# Patient Record
Sex: Male | Born: 1965
Health system: Southern US, Community
[De-identification: ages and names within clinical notes are randomized; demographics above are authoritative.]

## PROBLEM LIST (undated history)

## (undated) DIAGNOSIS — G709 Myoneural disorder, unspecified: Secondary | ICD-10-CM

## (undated) DIAGNOSIS — R200 Anesthesia of skin: Secondary | ICD-10-CM

## (undated) DIAGNOSIS — K219 Gastro-esophageal reflux disease without esophagitis: Secondary | ICD-10-CM

## (undated) DIAGNOSIS — G629 Polyneuropathy, unspecified: Secondary | ICD-10-CM

## (undated) DIAGNOSIS — I10 Essential (primary) hypertension: Secondary | ICD-10-CM

## (undated) DIAGNOSIS — F419 Anxiety disorder, unspecified: Secondary | ICD-10-CM

## (undated) DIAGNOSIS — M1990 Unspecified osteoarthritis, unspecified site: Secondary | ICD-10-CM

## (undated) DIAGNOSIS — T753XXA Motion sickness, initial encounter: Secondary | ICD-10-CM

## (undated) DIAGNOSIS — E669 Obesity, unspecified: Secondary | ICD-10-CM

## (undated) HISTORY — DX: Unspecified osteoarthritis, unspecified site: M19.90

## (undated) HISTORY — PX: WISDOM TOOTH EXTRACTION: SHX21

## (undated) HISTORY — PX: LEG SURGERY: SHX1003

## (undated) HISTORY — DX: Anxiety disorder, unspecified: F41.9

---
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
## 2011-11-13 HISTORY — PX: SHOULDER SURGERY: SHX246

## 2016-09-26 DIAGNOSIS — S82843A Displaced bimalleolar fracture of unspecified lower leg, initial encounter for closed fracture: Secondary | ICD-10-CM | POA: Insufficient documentation

## 2016-09-26 DIAGNOSIS — S93429A Sprain of deltoid ligament of unspecified ankle, initial encounter: Secondary | ICD-10-CM | POA: Insufficient documentation

## 2016-09-26 DIAGNOSIS — M24073 Loose body in unspecified ankle: Secondary | ICD-10-CM | POA: Insufficient documentation

## 2016-09-26 DIAGNOSIS — M2408 Loose body, other site: Secondary | ICD-10-CM

## 2017-09-02 DIAGNOSIS — R030 Elevated blood-pressure reading, without diagnosis of hypertension: Secondary | ICD-10-CM | POA: Insufficient documentation

## 2017-09-02 DIAGNOSIS — G5791 Unspecified mononeuropathy of right lower limb: Secondary | ICD-10-CM | POA: Insufficient documentation

## 2017-09-02 DIAGNOSIS — S8490XA Injury of unspecified nerve at lower leg level, unspecified leg, initial encounter: Secondary | ICD-10-CM | POA: Insufficient documentation

## 2017-09-02 DIAGNOSIS — E669 Obesity, unspecified: Secondary | ICD-10-CM | POA: Insufficient documentation

## 2017-09-02 HISTORY — DX: Obesity, unspecified: E66.9

## 2018-04-29 DIAGNOSIS — S9306XA Dislocation of unspecified ankle joint, initial encounter: Secondary | ICD-10-CM | POA: Insufficient documentation

## 2018-12-13 ENCOUNTER — Telehealth: Payer: Self-pay | Admitting: Physician Assistant

## 2018-12-13 DIAGNOSIS — M542 Cervicalgia: Secondary | ICD-10-CM

## 2018-12-13 DIAGNOSIS — M5412 Radiculopathy, cervical region: Secondary | ICD-10-CM

## 2018-12-13 NOTE — Progress Notes (Signed)
Based on what you shared with me it looks like you have a serious condition that should be evaluated in a face to face office visit.  NOTE: If you entered your credit card information for this eVisit, you will not be charged. You may see a "hold" on your card for the $30 but that hold will drop off and you will not have a charge processed.  If you are having a true medical emergency please call 911.  If you need an urgent face to face visit, [facility] has four urgent care centers for your convenience.  If you need care fast and have a high deductible or no insurance consider:   [email or web address] to reserve your spot online an avoid wait times  [facility] [address], [address] [place], [place] [address] 8 am to 8 pm Monday-Friday 10 am to 4 pm Saturday-Sunday *Across the street from [employer]  [facility]  [address]  [place], [address] 8 am to 5 pm Monday-Friday * In the [facility]   The following sites will take your  insurance:  . [facility]  [phone] Get Driving Directions Find a Provider at this Location  [address] [place], [place] [address] . 10 am to 8 pm Monday-Friday . 12 pm to 8 pm Saturday-Sunday   . [facility]  [phone] Get Driving Directions Find a Provider at this Location  [address], [address] [place], [place] [address] . 8 am to 8 pm Monday-Friday . 9 am to 6 pm Saturday . 11 am to 6 pm Sunday   . [facility] [place]  [phone] Get Driving Directions  [address] [place],  [address] . 8 am to 8 pm Monday-Friday . 8 am to 4 pm Saturday-Sunday   Your e-visit answers were reviewed by a board certified advanced clinical practitioner to complete your personal care plan.  Thank you for using e-Visits.  

## 2018-12-23 DIAGNOSIS — S86319A Strain of muscle(s) and tendon(s) of peroneal muscle group at lower leg level, unspecified leg, initial encounter: Secondary | ICD-10-CM | POA: Insufficient documentation

## 2018-12-24 ENCOUNTER — Encounter: Payer: Self-pay | Admitting: Physician Assistant

## 2018-12-24 ENCOUNTER — Ambulatory Visit: Payer: Self-pay | Admitting: Physician Assistant

## 2018-12-24 VITALS — BP 124/86 | HR 83 | Temp 98.4°F | Resp 16 | Ht 70.0 in | Wt 278.0 lb

## 2018-12-24 DIAGNOSIS — Z0000 Encounter for general adult medical examination without abnormal findings: Secondary | ICD-10-CM

## 2018-12-24 DIAGNOSIS — M5412 Radiculopathy, cervical region: Secondary | ICD-10-CM

## 2018-12-24 MED ORDER — PREDNISONE 20 MG PO TABS: ORAL_TABLET | ORAL | 0 refills | Status: DC

## 2018-12-24 MED ORDER — FAMOTIDINE 20 MG PO TABS: 20.0000 mg | ORAL_TABLET | Freq: Every day | ORAL | 0 refills | Status: DC

## 2018-12-24 NOTE — Patient Instructions (Addendum)
Thank you for choosing [facility] for your health care needs.  You have been diagnosed with cervical radiculopathy. You have been prescribed a tapering course of prednisone.  You have also been prescribed famotidine (Pepcid) to help prevent stomach upset while on prednisone.  Recommend applying ice and/or heat to area of discomfort. Perform gentle stretching exercises. Massage area. May use over the counter [place], IcyHot or BioFreeze. May apply prescribed Voltaren gel.  Recommend you establish care with a neurosurgeon or orthopedic surgeon for further evaluation and continued management of cervical radiculopathy.  Recommend you establish care with a family physician. Given list of local providers.  Cervical Radiculopathy  Cervical radiculopathy means that a nerve in the neck is pinched or bruised. This can cause pain or loss of feeling (numbness) that runs from your neck to your arm and fingers. Follow these instructions at home: Managing pain  Take over-the-counter and prescription medicines only as told by your doctor.  If directed, put ice on the injured or painful area. ? Put ice in a plastic bag. ? Place a towel between your skin and the bag. ? Leave the ice on for 20 minutes, 2-3 times per day.  If ice does not help, you can try using heat. Take a warm shower or warm bath, or use a heat pack as told by your doctor.  You may try a gentle neck and shoulder massage. Activity  Rest as needed. Follow instructions from your doctor about any activities to avoid.  Do exercises as told by your doctor or physical therapist. General instructions  If you were given a soft collar, wear it as told by your doctor.  Use a flat pillow when you sleep.  Keep all follow-up visits as told by your doctor. This is important. Contact a doctor if:  Your condition does not improve with treatment. Get help right away if:  Your pain gets worse and is not controlled with medicine.  You  lose feeling or feel weak in your hand, arm, face, or leg.  You have a fever.  You have a stiff neck.  You cannot control when you poop or pee (have incontinence).  You have trouble with walking, balance, or talking. This information is not intended to replace advice given to you by your health care provider. Make sure you discuss any questions you have with your health care provider. Document Released: 10/18/2011 Document Revised: 04/05/2016 Document Reviewed: 12/23/2014   Health Maintenance, Male A healthy lifestyle and preventive care is important for your health and wellness. Ask your health care provider about what schedule of regular examinations is right for you. What should I know about weight and diet? Eat a Healthy Diet  Eat plenty of vegetables, fruits, whole grains, low-fat dairy products, and lean protein.  Do not eat a lot of foods high in solid fats, added sugars, or salt.  Maintain a Healthy Weight Regular exercise can help you achieve or maintain a healthy weight. You should:  Do at least 150 minutes of exercise each week. The exercise should increase your heart rate and make you sweat (moderate-intensity exercise).  Do strength-training exercises at least twice a week. Watch Your Levels of Cholesterol and Blood Lipids  Have your blood tested for lipids and cholesterol every 5 years starting at 52 years of age. If you are at high risk for heart disease, you should start having your blood tested when you are 52 years old. You may need to have your cholesterol levels checked more often  if: ? Your lipid or cholesterol levels are high. ? You are older than 52 years of age. ? You are at high risk for heart disease. What should I know about cancer screening? Many types of cancers can be detected early and may often be prevented. Lung Cancer  You should be screened every year for lung cancer if: ? You are a current smoker who has smoked for at least 30 years. ? You  are a former smoker who has quit within the past 15 years.  Talk to your health care provider about your screening options, when you should start screening, and how often you should be screened. Colorectal Cancer  Routine colorectal cancer screening usually begins at 52 years of age and should be repeated every 5-10 years until you are 52 years old. You may need to be screened more often if early forms of precancerous polyps or small growths are found. Your health care provider may recommend screening at an earlier age if you have risk factors for colon cancer.  Your health care provider may recommend using home test kits to check for hidden blood in the stool.  A small camera at the end of a tube can be used to examine your colon (sigmoidoscopy or colonoscopy). This checks for the earliest forms of colorectal cancer. Prostate and Testicular Cancer  Depending on your age and overall health, your health care provider may do certain tests to screen for prostate and testicular cancer.  Talk to your health care provider about any symptoms or concerns you have about testicular or prostate cancer. Skin Cancer  Check your skin from head to toe regularly.  Tell your health care provider about any new moles or changes in moles, especially if: ? There is a change in a mole's size, shape, or color. ? You have a mole that is larger than a pencil eraser.  Always use sunscreen. Apply sunscreen liberally and repeat throughout the day.  Protect yourself by wearing long sleeves, pants, a wide-brimmed hat, and sunglasses when outside. What should I know about heart disease, diabetes, and high blood pressure?  If you are 42-44 years of age, have your blood pressure checked every 3-5 years. If you are 84 years of age or older, have your blood pressure checked every year. You should have your blood pressure measured twice-once when you are at a hospital or clinic, and once when you are not at a hospital or  clinic. Record the average of the two measurements. To check your blood pressure when you are not at a hospital or clinic, you can use: ? An automated blood pressure machine at a pharmacy. ? A home blood pressure monitor.  Talk to your health care provider about your target blood pressure.  If you are between 38-59 years old, ask your health care provider if you should take aspirin to prevent heart disease.  Have regular diabetes screenings by checking your fasting blood sugar level. ? If you are at a normal weight and have a low risk for diabetes, have this test once every three years after the age of 35. ? If you are overweight and have a high risk for diabetes, consider being tested at a younger age or more often.  A one-time screening for abdominal aortic aneurysm (AAA) by ultrasound is recommended for men aged 6-75 years who are current or former smokers. What should I know about preventing infection? Hepatitis B If you have a higher risk for hepatitis B, you should be  screened for this virus. Talk with your health care provider to find out if you are at risk for hepatitis B infection. Hepatitis C Blood testing is recommended for:  Everyone born from [birth year] through 1964.  Anyone with known risk factors for hepatitis C. Sexually Transmitted Diseases (STDs)  You should be screened each year for STDs including gonorrhea and chlamydia if: ? You are sexually active and are younger than 52 years of age. ? You are older than 52 years of age and your health care provider tells you that you are at risk for this type of infection. ? Your sexual activity has changed since you were last screened and you are at an increased risk for chlamydia or gonorrhea. Ask your health care provider if you are at risk.  Talk with your health care provider about whether you are at high risk of being infected with HIV. Your health care provider may recommend a prescription medicine to help prevent HIV  infection. What else can I do?  Schedule regular health, dental, and eye exams.  Stay current with your vaccines (immunizations).  Do not use any tobacco products, such as cigarettes, chewing tobacco, and e-cigarettes. If you need help quitting, ask your health care provider.  Limit alcohol intake to no more than 2 drinks per day. One drink equals 12 ounces of beer, 5 ounces of wine, or 1 ounces of hard liquor.  Do not use street drugs.  Do not share needles.  Ask your health care provider for help if you need support or information about quitting drugs.  Tell your health care provider if you often feel depressed.  Tell your health care provider if you have ever been abused or do not feel safe at home. This information is not intended to replace advice given to you by your health care provider. Make sure you discuss any questions you have with your health care provider. Document Released: 04/26/2008 Document Revised: 06/27/2016 Document Reviewed: 08/02/2015 Elsevier Interactive Patient Education  2019 [employer].  Chartered certified accountant Patient Education  [employer].

## 2018-12-24 NOTE — Progress Notes (Signed)
Patient ID: Adam Hernandez DOB: 1966-03-08 AGE: 53 y.o. MRN: [id number]   PCP: No primary care provider on file.   Chief Complaint:  Chief Complaint  Patient presents with  . CPE/Neck issue     Subjective:    HPI:  Adam Hernandez is a 53 y.o. male presents for evaluation  Chief Complaint  Patient presents with  . CPE/Neck issue   54 year old male presents to [facility] for basic wellness physical. Does not currently have a PCP. Patient used to work for [employer]. Had biennial DOT physicals. Denies previous history of blood pressure, pulse, urinalysis, visual acuity test, hearing test (forced whisper), etc. Patient denies any previous chronic medical condition diagnoses; including HTN, hypercholesterolemia, CAD, DM2. Patient, other than current orthopedic medications, has never been on a prescription medication regularly.   Patient saw his eye doctor last week. Has a new pair of glasses coming in the mail. Patient wears glasses for near sightedness. Has OTC reading glasses, rarely uses.  Patient is interested in weight loss. States since semi-recent right ankle injury has gained 50lbs.   Patient states his wife started a job with [employer] one year ago. They both received a letter in the mail that stated to keep health insurance costs low, they would have to have an annual physical exam performed.  Patient also presents with second complaint. Patient reports two week history of neck pain. Began after picking up a 12-pack of soda. Leaned to the left side wrong, felt pain immediately. Has persisted; not worsened or improved. Less severe with no movement. Exacerbated with neck movement. Patient in 2012 suffered Worker's Comp injury with [employer]; 500lb truck pull-down door fell/became unhinged, and struck patient. Patient suffered two herniated cervical discs (C4-5 and C5-6) as well as left rotator cuff tear. Patient had MRI of both cervical spine and left shoulder. Underwent left  shoulder repair. Performed PT for neck and shoulder. Patient states pain never fully resolved, but he was able to return to work. Patient states approximately once every two years, he will irritate his neck and develop radiculopathy. Patient reports currently has posterior neck pain, associated posterior left shoulder muscle spasm/tightness, and sharp shooting pain along left arm with numbness/tingling in left hand (thumb, index finger, and middle finger). Denies arm weakness. Patient with chronic limited motion (15% disability) in neck and left shoulder since injury. Patient is right hand dominant.  Patient is interested in establishing care with a local neurosurgeon for further evaluation and treatment/management of herniated cervical discs.  Patient completed an E-Visit on 12/13/2018 with Philis Fendt PA-C. Reported mid to upper back pain. Moderate in severity. Seconday to an injury. History of cervical disc herniation 8 years ago. States he frequently has "flare ups". Requested course of prednisone. Provider refunded money and advised patient be seen in-person for further evaluation and treatment.  Patient currently under care of [facility], Dr. Daniel Nones, for right ankle injury. Worker's Comp. Diagnosis of traumatic arthropathy of the ankle/foot, peroneal nerve injury, closed traumatic dislocation of ankle joint, and strain of peroneal tendon. Patient last seen yesterday. Scheduled for an MRI. Currently on multiple medications: Celebrex (Meloxicam replacement, due to side effect of tinnitus), Voltaren gel, Gabapentin, and Tylenol/ibuprofen prn.  A limited review of symptoms was performed, pertinent positives and negatives as mentioned in HPI.  The following portions of the patient's history were reviewed and updated as appropriate: allergies, current medications and past medical history.  Patient Active Problem List   Diagnosis Date Noted  .  Closed dislocation of ankle 04/29/2018  .  Elevated blood pressure reading 09/02/2017  . Neuropathy of right ankle 09/02/2017  . Obesity (BMI 35.0-39.9 without comorbidity) 09/02/2017  . Injury of nerve of lower extremity 09/02/2017  . Closed bimalleolar fracture 09/26/2016    No Known Allergies  Current Outpatient Medications on File Prior to Visit  Medication Sig Dispense Refill  . acetaminophen (TYLENOL) 500 MG tablet Take by mouth.    . celecoxib (CELEBREX) 200 MG capsule Celebrex 200 mg capsule  Take 1 capsule every day by oral route.    . diclofenac sodium (VOLTAREN) 1 % GEL diclofenac 1 % topical gel  APPLY 2 GRAM TO THE AFFECTED AREA(S) BY TOPICAL ROUTE 4 TIMES PER DAY    . gabapentin (NEURONTIN) 400 MG capsule gabapentin 400 mg capsule  TAKE 1 CAPSULE BY MOUTH FOUR TIMES DAILY    . ibuprofen (ADVIL,MOTRIN) 200 MG tablet Take by mouth.    . sildenafil (REVATIO) 20 MG tablet sildenafil (pulmonary hypertension) 20 mg tablet  Take 2 tablets by mouth one hour prior to sex as needed. Do not take more than one dose every 24 hours.     No current facility-administered medications on file prior to visit.        Objective:   Vitals:   12/24/18 1009  BP: 124/86  Pulse: 83  Resp: 16  Temp: 98.4 F (36.9 C)  SpO2: 96%     Wt Readings from Last 3 Encounters:  12/24/18 278 lb (126.1 kg)    Physical Exam:   General Appearance:  Patient sitting comfortably on examination table. Conversational. Kermit Balo self-historian. In no acute distress. Afebrile.   Head:  Normocephalic, without obvious abnormality, atraumatic  Eyes:  PERRL, conjunctiva/corneas clear, EOM's intact Good vision, with corrective lenses (glasses)  Ears:  Bilateral ear canals WNL. No erythema or edema. No discharge/drainage. Bilateral TMs WNL. No erythema, injection, or serous effusion. No scar tissue.  Nose: Nares normal, septum midline. No discharge. Normal mucosa. No sinus tenderness with percussion/palpation.  Throat: Lips, mucosa, and tongue normal;  teeth and gums normal. Throat reveals no erythema. Tonsils with no enlargement or exudate.  Neck: Supple, symmetrical, trachea midline, no adenopathy  Lungs:   Clear to auscultation bilaterally, respirations unlabored  Heart:  Regular rate and rhythm, S1 and S2 normal, no murmur, rub, or gallop (Patient reports childhood murmur, no murmur audible to provider today)  Abdomen:   Soft, non-tender, bowel sounds active all four quadrants,  no masses, no organomegaly  Extremities: Cervical, thoracic and lumbar spine normal to inspection. Mild midline cervical tenderness at superior aspect. No midline tenderness along lower cervical, thoracic or lumbar spine. No palpable stepoff, deformity, or crepitus. Limited ROM of neck; pain with right rotation, left lateral flexion, and flexion (chin to chest). Full ROM of right shoulder. Limited ROM of left shoulder; unable to perform Apley scratch test. Weakness with empty can test. 5/5 motor strength; pain with abduction against resistance. Strong radial pulse. Good grip strength bilaterally. Brisk capillary refill.  Pulses: 2+ and symmetric  Skin: Skin color, texture, turgor normal, no rashes or lesions  Lymph nodes: Cervical, supraclavicular, and axillary nodes normal  Neurologic: Normal    Assessment & Plan:    Exam findings, diagnosis etiology and medication use and indications reviewed with patient. Follow-Up and discharge instructions provided. No emergent/urgent issues found on exam.  Patient education was provided.   Patient verbalized understanding of information provided and agrees with plan of care (POC), all questions answered.  The patient is advised to call or return to clinic if condition does not see an improvement in symptoms, or to seek the care of the closest emergency department if condition worsens with the below plan.    1. Cervical radiculopathy - predniSONE (DELTASONE) 20 MG tablet; Take 3 tabs by mouth once a day x 3 days, then 2 tabs  by mouth once a day x 3 days, then 1 tab by mouth once a day x 3 days  Dispense: 18 tablet; Refill: 0 - famotidine (PEPCID) 20 MG tablet; Take 1 tablet (20 mg total) by mouth daily for 9 days.  Dispense: 9 tablet; Refill: 0  2. General medical examination  Patient [facility] to [facility] -  with two requests. Acute care visit for cervical radiculopathy. S/p injury in 2012 with known herniated cervical discs. Recent irritation via lifting heavy object. Prescribed prednisone taper; has worked for patient in the past. Discussed ice/heat, rest, stretching exercises, and OTC topical [place] or IcyHot. Advised patient schedule appointment with neurosurgeon or orthopedic surgeon; feel given frequent flare-ups, patient would benefit from repeat MRI (last cervical MRI at least 7 years ago), possible CESIs, and/or discussion of surgery. Advised patient, if he develops worsening pain or weakness should be seen immediately, at ED or orthopedic urgent care. Patient agrees with plan.  Patient also with request for general physical examination. Performed basic physical. Discussed health maintenance. Had long discussion with patient in regards to importance of establishing care with a PCP. Patient in need of regular health checks including lipid panel, thyroid panel, PSA (maybe), A1C, possible tetanus update, possible candidate for herpes zoster vaccine, scheduling of colonoscopy, etc. Patient also interested in nutritionist referral for weight loss.  Discussed with patient, unsure if [facility] basic wellness physical is accepted as annual examination for insurance purposes. Patient stated he understood and was ok with $40 charge regardless.    Darlin Priestly, MHS, PA-C Montey Hora, MHS, PA-C Advanced Practice Provider [facility]  [address], [place] [place], [place], [place] [address] (p):   [phone] [email or web address]

## 2018-12-26 ENCOUNTER — Telehealth: Payer: Self-pay | Admitting: Emergency Medicine

## 2018-12-26 NOTE — Telephone Encounter (Signed)
Left message following up on visit with [facility] 

## 2019-01-06 DIAGNOSIS — M659 Synovitis and tenosynovitis, unspecified: Secondary | ICD-10-CM | POA: Insufficient documentation

## 2019-01-15 ENCOUNTER — Encounter (INDEPENDENT_AMBULATORY_CARE_PROVIDER_SITE_OTHER): Payer: Self-pay | Admitting: Specialist

## 2019-01-15 ENCOUNTER — Ambulatory Visit (INDEPENDENT_AMBULATORY_CARE_PROVIDER_SITE_OTHER): Payer: 59 | Admitting: Specialist

## 2019-01-15 ENCOUNTER — Ambulatory Visit (INDEPENDENT_AMBULATORY_CARE_PROVIDER_SITE_OTHER): Payer: 59

## 2019-01-15 VITALS — BP 133/69 | HR 72 | Ht 71.0 in | Wt 270.0 lb

## 2019-01-15 DIAGNOSIS — M4722 Other spondylosis with radiculopathy, cervical region: Secondary | ICD-10-CM

## 2019-01-15 DIAGNOSIS — M5030 Other cervical disc degeneration, unspecified cervical region: Secondary | ICD-10-CM

## 2019-01-15 DIAGNOSIS — M542 Cervicalgia: Secondary | ICD-10-CM

## 2019-01-15 MED ORDER — DIAZEPAM 5 MG PO TABS: ORAL_TABLET | ORAL | 0 refills | Status: DC

## 2019-01-15 MED ORDER — METHYLPREDNISOLONE 4 MG PO TABS: ORAL_TABLET | ORAL | 0 refills | Status: DC

## 2019-01-15 NOTE — Patient Instructions (Signed)
Avoid overhead lifting and overhead use of the arms. Do not lift greater than 5 lbs. Adjust head rest in vehicle to prevent hyperextension if rear ended. Take extra precautions to avoid falling. MRI of the cervical spine to assess for left C5-6 and Left C6-7is ordered.

## 2019-01-15 NOTE — Progress Notes (Addendum)
Office Visit Note   Patient: Adam Hernandez           Date of Birth: [birth year]           MRN: [id number] Visit Date: 01/15/2019              Requested by: No referring provider defined for this encounter. PCP: No primary care provider on file.   Assessment & Plan: Visit Diagnoses:  1. Cervicalgia   2. Other cervical disc degeneration, unspecified cervical region   3. Other spondylosis with radiculopathy, cervical region     Plan: Avoid overhead lifting and overhead use of the arms. Do not lift greater than 5 lbs. Adjust head rest in vehicle to prevent hyperextension if rear ended. Take extra precautions to avoid falling. MRI of the cervical spine to assess for left C5-6 and Left C6-7is ordered.   Follow-Up Instructions: Return in about 3 weeks (around 02/05/2019).   Orders:  Orders Placed This Encounter  Procedures  . XR Cervical Spine 2 or 3 views  . MR Cervical Spine w/o contrast   Meds ordered this encounter  Medications  . diazepam (VALIUM) 5 MG tablet    Sig: Take one tablet at the MRI and may repeat x 1.    Dispense:  2 tablet    Refill:  0  . methylPREDNISolone (MEDROL) 4 MG tablet    Sig: 6 day medrol dose pak, take as directed.    Dispense:  21 tablet    Refill:  0      Procedures: No procedures performed   Clinical Data: No additional findings.   Subjective: Chief Complaint  Patient presents with  . 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 He had an injury to the left shoulder requiring surgery by DrMallie Mussel and he did have a small chip of bone at the C4-5 level treated conservatively. After his  Shoulder surgery returned to work in May after November surgery. Worked another 3 years before stopping after having a non work related right ankle fracture. He has been experiencing left hand numbness and tingling into the left hand radial 3  digits. The numbness in the left hand is worsening, there is increased clumbsiness and decreased grip strength. He is having difficulty with sleep due to left arm pain and pain is worsened with turning head to the left and extension Flexion is not as bad, raising and using the left arm over head sets off the pain. Left biceps is weak. Left shoulder injury did result in some weakness ever since the surgery and injury on the Job. He is on pain meds for discomfort in the right ankle and foot. Gabapentin 400 four time per day, uses celebrex and diclofenac gel and supplements with tylenol. He sees Dr.Calvin in pain management in [place] with [facility]. No gait disturbance, no balance difficulty, no bowel or bladder difficulty.    Review of Systems  Constitutional: Negative.   HENT: Negative.   Eyes: Negative.   Respiratory: Negative.   Cardiovascular: Negative.   Gastrointestinal: Negative.   Endocrine: Negative.   Genitourinary: Negative.   Musculoskeletal: Negative.   Skin: Negative.   Allergic/Immunologic: Negative.   Neurological: Negative.   Hematological: Negative.   Psychiatric/Behavioral: Negative.      Objective: Vital Signs: BP 133/69 (BP Location: Left Arm, Patient Position: Sitting)  Pulse 72   Ht 5\' 11"  (1.803 m)   Wt 270 lb (122.5 kg)   BMI 37.66 kg/m   Physical Exam Constitutional:      Appearance: He is well-developed.  HENT:     Head: Normocephalic and atraumatic.  Eyes:     Pupils: Pupils are equal, round, and reactive to light.  Neck:     Musculoskeletal: Normal range of motion and neck supple.  Pulmonary:     Effort: Pulmonary effort is normal.     Breath sounds: Normal breath sounds.  Abdominal:     General: Bowel sounds are normal.     Palpations: Abdomen is soft.  Skin:    General: Skin is warm and dry.  Neurological:     Mental Status: He is alert and oriented to person, place, and time.  Psychiatric:        Behavior: Behavior normal.          Thought Content: Thought content normal.        Judgment: Judgment normal.     Back Exam   Tenderness  The patient is experiencing tenderness in the cervical.  Range of Motion  Extension:  50 abnormal  Flexion: normal  Lateral bend right: 70  Lateral bend left:  50 abnormal   Muscle Strength  Right Quadriceps:  5/5  Left Quadriceps:  5/5  Right Hamstrings:  5/5  Left Hamstrings:  5/5   Tests  Straight leg raise right: negative Straight leg raise left: negative  Reflexes  Patellar: normal Achilles: normal Biceps: normal Babinski's sign: normal   Other  Toe walk: normal Heel walk: normal Sensation: normal Gait: normal  Erythema: no back redness Scars: absent  Comments:  Motor without focal deficit.      Specialty Comments:  No specialty comments available.  Imaging: Xr Cervical Spine 2 Or 3 Views  Result Date: 01/15/2019 AP and lateral flexion and extension radiographs of the cervical spine shows DDD C5-6, C6-7 and C7-T1. There is straightening of the cervical spine over the lower cervical segments. Uncovertebral changes bilateral C5-6 and C6-7 with likely foramenal encroachment at these levels.     PMFS History: Patient Active Problem List   Diagnosis Date Noted  . Closed dislocation of ankle 04/29/2018  . Elevated blood pressure reading 09/02/2017  . Neuropathy of right ankle 09/02/2017  . Obesity (BMI 35.0-39.9 without comorbidity) 09/02/2017  . Injury of nerve of lower extremity 09/02/2017  . Closed bimalleolar fracture 09/26/2016   History reviewed. No pertinent past medical history.  History reviewed. No pertinent family history.  History reviewed. No pertinent surgical history. Social History   Occupational History  . Not on file  Tobacco Use  . Smoking status: Never Smoker  . Smokeless tobacco: Never Used  Substance and Sexual Activity  . Alcohol use: Not on file  . Drug use: Not on file  . Sexual activity: Not on file

## 2019-01-15 NOTE — Addendum Note (Signed)
Addended by: [name] on: 01/15/2019 01:00 PM   Modules accepted: Orders

## 2019-01-20 ENCOUNTER — Encounter: Payer: Self-pay | Admitting: Family Medicine

## 2019-01-20 ENCOUNTER — Ambulatory Visit (INDEPENDENT_AMBULATORY_CARE_PROVIDER_SITE_OTHER): Payer: 59 | Admitting: Family Medicine

## 2019-01-20 VITALS — BP 120/78 | HR 76 | Ht 71.0 in | Wt 276.0 lb

## 2019-01-20 DIAGNOSIS — J0100 Acute maxillary sinusitis, unspecified: Secondary | ICD-10-CM | POA: Diagnosis not present

## 2019-01-20 DIAGNOSIS — Z7689 Persons encountering health services in other specified circumstances: Secondary | ICD-10-CM | POA: Diagnosis not present

## 2019-01-20 DIAGNOSIS — D1801 Hemangioma of skin and subcutaneous tissue: Secondary | ICD-10-CM | POA: Diagnosis not present

## 2019-01-20 DIAGNOSIS — D229 Melanocytic nevi, unspecified: Secondary | ICD-10-CM

## 2019-01-20 MED ORDER — AMOXICILLIN 500 MG PO CAPS: 500.0000 mg | ORAL_CAPSULE | Freq: Three times a day (TID) | ORAL | 0 refills | Status: DC

## 2019-01-20 NOTE — Progress Notes (Signed)
Date:  01/20/2019   Name:  Adam Hernandez   DOB:  07-21-1966   MRN:  [id number]   Chief Complaint: Establish Care and Nevus (on back)  Patient is a 53 year old male who presents for an establish as new patient exam. The patient reports the following problems: chronic pain/nevus. Health maintenance has been reviewed  Sinusitis  This is a new problem. The current episode started in the past 7 days. The problem has been waxing and waning since onset. Maximum temperature: low grade. His pain is at a severity of 2/10. Associated symptoms include congestion and sinus pressure. Pertinent negatives include no chills, coughing, ear pain, headaches, neck pain, shortness of breath or sore throat.    Review of Systems  Constitutional: Negative for chills and fever.  HENT: Positive for congestion and sinus pressure. Negative for drooling, ear discharge, ear pain and sore throat.   Respiratory: Negative for cough, shortness of breath and wheezing.   Cardiovascular: Negative for chest pain, palpitations and leg swelling.  Gastrointestinal: Negative for abdominal pain, blood in stool, constipation, diarrhea and nausea.  Endocrine: Negative for polydipsia.  Genitourinary: Negative for dysuria, frequency, hematuria and urgency.  Musculoskeletal: Negative for back pain, myalgias and neck pain.  Skin: Negative for rash.  Allergic/Immunologic: Negative for environmental allergies.  Neurological: Negative for dizziness and headaches.  Hematological: Does not bruise/bleed easily.  Psychiatric/Behavioral: Negative for suicidal ideas. The patient is not nervous/anxious.     Patient Active Problem List   Diagnosis Date Noted  . Synovitis and tenosynovitis 01/06/2019  . Strain of peroneal tendon 12/23/2018  . Closed dislocation of ankle 04/29/2018  . Elevated blood pressure reading 09/02/2017  . Neuropathy of right ankle 09/02/2017  . Obesity (BMI 35.0-39.9 without comorbidity) 09/02/2017  . Injury of  nerve of lower extremity 09/02/2017  . Closed bimalleolar fracture 09/26/2016  . Loose body in ankle and foot joint 09/26/2016  . Sprain of deltoid ligament of ankle 09/26/2016    No Known Allergies  Past Surgical History:  Procedure Laterality Date  . LEG SURGERY Right    fractured leg and ankle- has screws and pins  . SHOULDER SURGERY Left 2013   labrial tear    Social History   Tobacco Use  . Smoking status: Never Smoker  . Smokeless tobacco: Never Used  Substance Use Topics  . Alcohol use: Yes    Comment: 5-10 per week  . Drug use: Never     Medication list has been reviewed and updated.  Current Meds  Medication Sig  . acetaminophen (TYLENOL) 500 MG tablet Take by mouth.  . celecoxib (CELEBREX) 200 MG capsule Celebrex 200 mg capsule  Take 1 capsule every day by oral route.  . diazepam (VALIUM) 5 MG tablet Take one tablet at the MRI and may repeat x 1.  . diclofenac sodium (VOLTAREN) 1 % GEL Pain management  . gabapentin (NEURONTIN) 400 MG capsule Pain management  . ibuprofen (ADVIL,MOTRIN) 200 MG tablet Take by mouth.  Marland Kitchen omeprazole (PRILOSEC) 20 MG capsule Take 20 mg by mouth as needed.   . sildenafil (REVATIO) 20 MG tablet sildenafil (pulmonary hypertension) 20 mg tablet  Take 2 tablets by mouth one hour prior to sex as needed. Do not take more than one dose every 24 hours.    PHQ 2/9 Scores 01/20/2019  PHQ - 2 Score 0  PHQ- 9 Score 0    Physical Exam Vitals signs and nursing note reviewed.  HENT:  Head: Normocephalic.     Jaw: There is normal jaw occlusion.     Right Ear: Tympanic membrane, ear canal and external ear normal.     Left Ear: Tympanic membrane, ear canal and external ear normal.     Nose:     Right Turbinates: Swollen.     Left Turbinates: Swollen.     Right Sinus: Maxillary sinus tenderness present. No frontal sinus tenderness.     Left Sinus: Maxillary sinus tenderness present. No frontal sinus tenderness.  Eyes:     General: No  scleral icterus.       Right eye: No discharge.        Left eye: No discharge.     Conjunctiva/sclera: Conjunctivae normal.     Pupils: Pupils are equal, round, and reactive to light.  Neck:     Musculoskeletal: Normal range of motion and neck supple.     Thyroid: No thyromegaly.     Vascular: No JVD.     Trachea: No tracheal deviation.  Cardiovascular:     Rate and Rhythm: Normal rate and regular rhythm.     Heart sounds: Normal heart sounds. No murmur. No friction rub. No gallop.   Pulmonary:     Effort: No respiratory distress.     Breath sounds: Normal breath sounds. No wheezing or rales.  Abdominal:     General: Bowel sounds are normal.     Palpations: Abdomen is soft. There is no mass.     Tenderness: There is no abdominal tenderness. There is no guarding or rebound.  Musculoskeletal: Normal range of motion.        General: No tenderness.  Lymphadenopathy:     Cervical: No cervical adenopathy.  Skin:    General: Skin is warm.     Findings: No rash.          Comments: Irregular border right scapula/ ?cyst/skin tag/hemangioma of left scrotal area  Neurological:     Mental Status: He is alert and oriented to person, place, and time.     Cranial Nerves: No cranial nerve deficit.     Deep Tendon Reflexes: Reflexes are normal and symmetric.     Wt Readings from Last 3 Encounters:  01/20/19 276 lb (125.2 kg)  01/15/19 270 lb (122.5 kg)  12/24/18 278 lb (126.1 kg)    BP 120/78   Pulse 76   Ht 5\' 11"  (1.803 m)   Wt 276 lb (125.2 kg)   BMI 38.49 kg/m   Assessment and Plan: 1. Establishing care with new doctor, encounter for Patient establish care with new physician today.  Patient's chart was reviewed medical visits, medications, labs, and referrals to specialist.  2. Acute maxillary sinusitis, recurrence not specified Patient has had a sinus infection for the past several days and exam is consistent with a maxillary sinus infection for which amoxicillin 500 mg 3  times a day was initiated. - amoxicillin (AMOXIL) 500 MG capsule; Take 1 capsule (500 mg total) by mouth 3 (three) times daily.  Dispense: 30 capsule; Refill: 0  3. Nevus Patient had a orthopedic appointment in which the nevus was noted on the back there was one irregular nevus it was noted in the right scapular area for which we will refer to dermatology for evaluation patient has been experiencing no pain or bleeding from this. - Ambulatory referral to Dermatology  4. Hemangioma of skin Either a skin tag/hemangioma/cyst was noted of the left scrotal area.  This does not appear to be  of concern but will ask dermatology to render an opinion as to whether the skin to be removed or watched as he is. - Ambulatory referral to Dermatology

## 2019-01-22 DIAGNOSIS — Z0000 Encounter for general adult medical examination without abnormal findings: Secondary | ICD-10-CM

## 2019-02-03 ENCOUNTER — Encounter (INDEPENDENT_AMBULATORY_CARE_PROVIDER_SITE_OTHER): Payer: Self-pay | Admitting: Specialist

## 2019-02-11 DIAGNOSIS — D1800 Hemangioma unspecified site: Secondary | ICD-10-CM | POA: Diagnosis not present

## 2019-02-11 DIAGNOSIS — D225 Melanocytic nevi of trunk: Secondary | ICD-10-CM | POA: Diagnosis not present

## 2019-02-11 DIAGNOSIS — D485 Neoplasm of uncertain behavior of skin: Secondary | ICD-10-CM | POA: Diagnosis not present

## 2019-02-16 ENCOUNTER — Encounter (INDEPENDENT_AMBULATORY_CARE_PROVIDER_SITE_OTHER): Payer: Self-pay | Admitting: Specialist

## 2019-02-18 ENCOUNTER — Other Ambulatory Visit (INDEPENDENT_AMBULATORY_CARE_PROVIDER_SITE_OTHER): Payer: Self-pay | Admitting: Specialist

## 2019-02-18 MED ORDER — METHYLPREDNISOLONE 4 MG PO TBPK: ORAL_TABLET | ORAL | 0 refills | Status: DC

## 2019-02-19 ENCOUNTER — Other Ambulatory Visit: Payer: Self-pay

## 2019-02-19 ENCOUNTER — Ambulatory Visit
Admission: RE | Admit: 2019-02-19 | Discharge: 2019-02-19 | Disposition: A | Discharge status: Home / Self Care | Payer: 59 | Source: Ambulatory Visit | Attending: Specialist | Admitting: Specialist

## 2019-02-19 DIAGNOSIS — M5030 Other cervical disc degeneration, unspecified cervical region: Secondary | ICD-10-CM

## 2019-02-19 DIAGNOSIS — M542 Cervicalgia: Secondary | ICD-10-CM

## 2019-02-25 NOTE — Progress Notes (Signed)
Has appt on 02/26/2019

## 2019-02-26 ENCOUNTER — Ambulatory Visit (INDEPENDENT_AMBULATORY_CARE_PROVIDER_SITE_OTHER): Payer: 59 | Admitting: Specialist

## 2019-02-26 ENCOUNTER — Other Ambulatory Visit: Payer: Self-pay

## 2019-02-26 ENCOUNTER — Encounter (INDEPENDENT_AMBULATORY_CARE_PROVIDER_SITE_OTHER): Payer: Self-pay | Admitting: Specialist

## 2019-02-26 VITALS — BP 146/90 | HR 79 | Ht 71.0 in | Wt 270.0 lb

## 2019-02-26 DIAGNOSIS — M5412 Radiculopathy, cervical region: Secondary | ICD-10-CM

## 2019-02-26 DIAGNOSIS — M5010 Cervical disc disorder with radiculopathy, unspecified cervical region: Secondary | ICD-10-CM | POA: Diagnosis not present

## 2019-02-26 DIAGNOSIS — M542 Cervicalgia: Secondary | ICD-10-CM | POA: Diagnosis not present

## 2019-02-26 DIAGNOSIS — M4722 Other spondylosis with radiculopathy, cervical region: Secondary | ICD-10-CM | POA: Diagnosis not present

## 2019-02-26 NOTE — Progress Notes (Signed)
Office Visit Note   Patient: Adam Hernandez           Date of Birth: 1966/05/09           MRN: [id number] Visit Date: 02/26/2019              Requested by: No referring provider defined for this encounter. PCP: Juline Patch, MD   Assessment & Plan: Visit Diagnoses:  1. Cervicalgia   2. Cervical radiculopathy   3. Other spondylosis with radiculopathy, cervical region   4. Herniation of cervical intervertebral disc with radiculopathy     Plan: Avoid overhead lifting and overhead use of the arms. Do not lift greater than 5 lbs. Adjust head rest in vehicle to prevent hyperextension if rear ended. Take extra precautions to avoid falling, including use of a cane if you feel weak. Scheduling secretary Kandice Hams. will call you to arrange for surgery for your cervical spine. If you wish a second opinion please let us know and we can arrange for you. If you have worsening arm or leg numbness or weakness please call or go to an ER. We will contact your cardiologist and primary care physicians to seek clearance for your surgery. Surgery will be an anterior discectomy and fusion at the C5-6 and C6-7 levels with decompression of the cervical spinal canal at C5-6 and C6-7 and removal of bone and disc pressing on the left sided C6 and C7nerve roots with bone grafting and plate and screws, Local bone graft as well and allograft bone graft and vivigen.  Risks of surgery include risks of infection, bleeding and risks to the spinal cord and  Risks of sore throat and difficulty swallowing which should  Improve over the next 4-6 weeks following surgery. Surgery is indicated due to upper extremity radiculopathy, lermittes phenomena and falls. In the future surgery at adjacent levels may be necessary but these levels do not appear to be related to your current symptoms or signs. Taking celebrex, gabapentin and voltaren gel for pain. He has taken a steroid dose pak.  Due to panedemic I  recommend that we try to put surgery off for about one month, refer for left C7-T1 ESI. If you are unable to tolerate the discomfort then surgery can be done as an emergency but due to risk of virus and surgeries being delayed  At the hospital I recommend waiting about one month.      Follow-Up Instructions: No follow-ups on file.   Orders:  No orders of the defined types were placed in this encounter.  No orders of the defined types were placed in this encounter.     Procedures: No procedures performed   Clinical Data: Findings:  CLINICAL DATA:  Cervicalgia  EXAM: MRI CERVICAL SPINE WITHOUT CONTRAST  TECHNIQUE: Multiplanar, multisequence MR imaging of the cervical spine was performed. No intravenous contrast was administered.  COMPARISON:  Cervical radiographs 01/15/2019  FINDINGS: Alignment: Normal  Vertebrae: Normal bone marrow.  Negative for fracture or mass  Cord: No cord signal abnormality identified. Cord evaluation limited by motion  Posterior Fossa, vertebral arteries, paraspinal tissues: Negative  Disc levels:  C2-3: Negative  C3-4: Negative  C4-5: Left foraminal encroachment due to spurring with expected impingement left C5 nerve root. Spinal canal and right foramen patent  C5-6: Moderate disc degeneration with diffuse uncinate spurring. Moderate right foraminal encroachment and severe left foraminal encroachment. Cord flattening with moderate spinal stenosis.  C6-7: Right paracentral disc protrusion with mild right foraminal encroachment.  Cord flattening and mild spinal stenosis on the right  C7-T1: Bilateral facet degeneration contributing to mild foraminal narrowing bilaterally.  IMPRESSION: Left foraminal encroachment C4-5  Moderate right foraminal encroachment and severe left foraminal encroachment C5-6 due to spurring. Moderate spinal stenosis C5-6  Right sided disc protrusion at C6-7 with mild right foraminal  encroachment  Mild foraminal narrowing bilaterally C7-T1.   Electronically Signed   By: Franchot Gallo M.D.   On: 02/19/2019 10:43     Subjective: Chief Complaint  Patient presents with  . Neck - Follow-up    MRI Review    53 year old right handed male with history of neck pain and radiation into the left arm with weakness in left arm and numbness. He is dropping items and noticing increasing clumbsiness and he is not using the left arm due to feeling of weakness and tendency to drop. He has increased neck and left arm pain and weakness. No bowel or bladder difficulty. He is seeing a  Pain management specialist and has a plan to undergo removal of plate and screws with Dr. Diona Browner. He has pain in the right ankle chronicly and will eventually need removal of the hardware right ankle. Numbness and tingling left thumb, index and middle finger. Some also into the left little and ring finger.   Review of Systems  Constitutional: Positive for activity change and unexpected weight change. Negative for appetite change, chills, diaphoresis, fatigue and fever.  HENT: Negative for congestion, dental problem, drooling, ear discharge, ear pain, facial swelling, hearing loss, mouth sores, nosebleeds, postnasal drip, rhinorrhea, sinus pressure, sinus pain, sneezing, sore throat, tinnitus, trouble swallowing and voice change.   Eyes: Negative.  Negative for photophobia, pain, discharge, redness, itching and visual disturbance.  Respiratory: Negative.  Negative for apnea, cough, choking, chest tightness, shortness of breath, wheezing and stridor.   Cardiovascular: Positive for leg swelling. Negative for chest pain and palpitations.  Gastrointestinal: Negative.  Negative for abdominal distention, abdominal pain, anal bleeding, blood in stool, constipation, diarrhea, nausea and rectal pain.  Endocrine: Negative.  Negative for cold intolerance, heat intolerance, polydipsia, polyphagia and polyuria.   Genitourinary: Negative.  Negative for difficulty urinating, dysuria, enuresis, flank pain, frequency, genital sores and hematuria.  Musculoskeletal: Positive for neck pain. Negative for arthralgias, back pain, gait problem, joint swelling and myalgias.  Skin: Negative.   Allergic/Immunologic: Negative.   Neurological: Positive for weakness and numbness. Negative for dizziness, tremors, seizures, syncope, facial asymmetry, speech difficulty, light-headedness and headaches.  Hematological: Negative.   Psychiatric/Behavioral: Negative.  Negative for agitation, behavioral problems, confusion, decreased concentration, dysphoric mood, hallucinations, self-injury, sleep disturbance and suicidal ideas. The patient is not nervous/anxious and is not hyperactive.      Objective: Vital Signs: BP (!) 146/90 (BP Location: Left Arm, Patient Position: Sitting)   Pulse 79   Ht 5\' 11"  (1.803 m)   Wt 270 lb (122.5 kg)   BMI 37.66 kg/m   Physical Exam Constitutional:      Appearance: He is well-developed.  HENT:     Head: Normocephalic and atraumatic.  Eyes:     Pupils: Pupils are equal, round, and reactive to light.  Neck:     Musculoskeletal: Normal range of motion and neck supple.  Pulmonary:     Effort: Pulmonary effort is normal.     Breath sounds: Normal breath sounds.  Abdominal:     General: Bowel sounds are normal.     Palpations: Abdomen is soft.  Skin:    General:  Skin is warm and dry.  Neurological:     Mental Status: He is alert and oriented to person, place, and time.  Psychiatric:        Behavior: Behavior normal.        Thought Content: Thought content normal.        Judgment: Judgment normal.     Back Exam   Tenderness  The patient is experiencing tenderness in the cervical.  Range of Motion  Extension:  50 abnormal  Flexion:  70 abnormal  Lateral bend right:  80 abnormal  Lateral bend left:  60 abnormal  Rotation right:  80 abnormal  Rotation left:  60 abnormal    Muscle Strength  Right Quadriceps:  5/5  Left Quadriceps:  5/5  Right Hamstrings:  5/5  Left Hamstrings:  5/5   Tests  Straight leg raise right: negative Straight leg raise left: negative  Reflexes  Patellar: normal Achilles: normal Biceps: normal Babinski's sign: normal   Other  Toe walk: normal Heel walk: normal Sensation: normal Gait: normal  Erythema: no back redness Scars: absent  Comments:  Left arm triceps weakness, left finger extension,  And volar left wrist flexion are 4/5. Biceps is 5-/5.      Specialty Comments:  No specialty comments available.  Imaging: No results found.   PMFS History: Patient Active Problem List   Diagnosis Date Noted  . Synovitis and tenosynovitis 01/06/2019  . Strain of peroneal tendon 12/23/2018  . Closed dislocation of ankle 04/29/2018  . Elevated blood pressure reading 09/02/2017  . Neuropathy of right ankle 09/02/2017  . Obesity (BMI 35.0-39.9 without comorbidity) 09/02/2017  . Injury of nerve of lower extremity 09/02/2017  . Closed bimalleolar fracture 09/26/2016  . Loose body in ankle and foot joint 09/26/2016  . Sprain of deltoid ligament of ankle 09/26/2016   Past Medical History:  Diagnosis Date  . Anxiety   . Arthritis     Family History  Adopted: Yes    Past Surgical History:  Procedure Laterality Date  . LEG SURGERY Right    fractured leg and ankle- has screws and pins  . SHOULDER SURGERY Left 2013   labrial tear   Social History   Occupational History  . Not on file  Tobacco Use  . Smoking status: Never Smoker  . Smokeless tobacco: Never Used  Substance and Sexual Activity  . Alcohol use: Yes    Comment: 5-10 per week  . Drug use: Never  . Sexual activity: Yes

## 2019-02-26 NOTE — Patient Instructions (Signed)
Plan: Avoid overhead lifting and overhead use of the arms. Do not lift greater than 5 lbs. Adjust head rest in vehicle to prevent hyperextension if rear ended. Take extra precautions to avoid falling, including use of a cane if you feel weak. Scheduling secretary Kandice Hams. will call you to arrange for surgery for your cervical spine. If you wish a second opinion please let us know and we can arrange for you. If you have worsening arm or leg numbness or weakness please call or go to an ER. We will contact your cardiologist and primary care physicians to seek clearance for your surgery. Surgery will be an anterior discectomy and fusion at the C5-6 and C6-7 levels with decompression of the cervical spinal canal at C5-6 and C6-7 and removal of bone and disc pressing on the left sided C6 and C7nerve roots with bone grafting and plate and screws, Local bone graft as well and allograft bone graft and vivigen.  Risks of surgery include risks of infection, bleeding and risks to the spinal cord and  Risks of sore throat and difficulty swallowing which should  Improve over the next 4-6 weeks following surgery. Surgery is indicated due to upper extremity radiculopathy, lermittes phenomena and falls. In the future surgery at adjacent levels may be necessary but these levels do not appear to be related to your current symptoms or signs. Taking celebrex, gabapentin and voltaren gel for pain. He has taken a steroid dose pak.  Due to panedemic I recommend that we try to put surgery off for about one month, refer for left C7-T1 ESI. If you are unable to tolerate the discomfort then surgery can be done as an emergency but due to risk of virus and surgeries being delayed  At the hospital I recommend waiting about one month.

## 2019-03-03 DIAGNOSIS — D485 Neoplasm of uncertain behavior of skin: Secondary | ICD-10-CM | POA: Diagnosis not present

## 2019-03-05 ENCOUNTER — Encounter (INDEPENDENT_AMBULATORY_CARE_PROVIDER_SITE_OTHER): Payer: Self-pay | Admitting: Specialist

## 2019-03-24 ENCOUNTER — Other Ambulatory Visit: Payer: Self-pay

## 2019-03-24 ENCOUNTER — Encounter: Payer: Self-pay | Admitting: Family Medicine

## 2019-03-24 ENCOUNTER — Ambulatory Visit (INDEPENDENT_AMBULATORY_CARE_PROVIDER_SITE_OTHER): Payer: 59 | Admitting: Family Medicine

## 2019-03-24 VITALS — BP 120/88 | HR 72 | Ht 71.0 in | Wt 278.0 lb

## 2019-03-24 DIAGNOSIS — Z0000 Encounter for general adult medical examination without abnormal findings: Secondary | ICD-10-CM

## 2019-03-24 DIAGNOSIS — E6609 Other obesity due to excess calories: Secondary | ICD-10-CM

## 2019-03-24 DIAGNOSIS — Z6838 Body mass index (BMI) 38.0-38.9, adult: Secondary | ICD-10-CM

## 2019-03-24 DIAGNOSIS — Z1211 Encounter for screening for malignant neoplasm of colon: Secondary | ICD-10-CM

## 2019-03-24 LAB — HEMOCCULT GUIAC POC 1CARD (OFFICE): Fecal Occult Blood, POC: NEGATIVE

## 2019-03-24 NOTE — Patient Instructions (Signed)

## 2019-03-24 NOTE — Progress Notes (Signed)
Date:  03/24/2019   Name:  Adam Hernandez   DOB:  1965/11/29   MRN:  [id number]   Chief Complaint: Annual Exam (no issues or concerns)  Patient is a 53 year old male who presents for a comprehensive physical exam. The patient reports the following problems: noted surgeries/neck/ankle. Health maintenance has been reviewed up to date.   Review of Systems  Constitutional: Negative for appetite change, chills, fatigue, fever and unexpected weight change.  HENT: Negative for congestion, dental problem, drooling, ear discharge, ear pain, facial swelling, hearing loss, mouth sores, nosebleeds, postnasal drip, rhinorrhea, sinus pain, sneezing, sore throat and trouble swallowing.   Eyes: Negative for photophobia, pain, discharge, redness, itching and visual disturbance.  Respiratory: Negative for cough, choking, chest tightness, shortness of breath and wheezing.   Cardiovascular: Negative for chest pain, palpitations and leg swelling.  Gastrointestinal: Negative for abdominal pain, blood in stool, constipation, diarrhea, nausea, rectal pain and vomiting.  Endocrine: Negative for cold intolerance, heat intolerance, polydipsia, polyphagia and polyuria.  Genitourinary: Negative for decreased urine volume, difficulty urinating, discharge, dysuria, flank pain, frequency, hematuria, penile pain, penile swelling, scrotal swelling, testicular pain and urgency.  Musculoskeletal: Negative for back pain, joint swelling, myalgias, neck pain and neck stiffness.  Skin: Negative for color change and rash.  Allergic/Immunologic: Negative for environmental allergies and immunocompromised state.  Neurological: Negative for dizziness, tremors, seizures, syncope, speech difficulty, weakness, light-headedness, numbness and headaches.  Hematological: Does not bruise/bleed easily.  Psychiatric/Behavioral: Negative for agitation, behavioral problems, confusion, dysphoric mood, hallucinations, self-injury and suicidal  ideas. The patient is not nervous/anxious.     Patient Active Problem List   Diagnosis Date Noted  . Synovitis and tenosynovitis 01/06/2019  . Strain of peroneal tendon 12/23/2018  . Closed dislocation of ankle 04/29/2018  . Elevated blood pressure reading 09/02/2017  . Neuropathy of right ankle 09/02/2017  . Obesity (BMI 35.0-39.9 without comorbidity) 09/02/2017  . Injury of nerve of lower extremity 09/02/2017  . Closed bimalleolar fracture 09/26/2016  . Loose body in ankle and foot joint 09/26/2016  . Sprain of deltoid ligament of ankle 09/26/2016    No Known Allergies  Past Surgical History:  Procedure Laterality Date  . LEG SURGERY Right    fractured leg and ankle- has screws and pins  . SHOULDER SURGERY Left 2013   labrial tear    Social History   Tobacco Use  . Smoking status: Never Smoker  . Smokeless tobacco: Never Used  Substance Use Topics  . Alcohol use: Yes    Comment: 5-10 per week  . Drug use: Never     Medication list has been reviewed and updated.  Current Meds  Medication Sig  . acetaminophen (TYLENOL) 500 MG tablet Take by mouth.  . celecoxib (CELEBREX) 200 MG capsule Celebrex 200 mg capsule  Take 1 capsule every day by oral route.  . diclofenac sodium (VOLTAREN) 1 % GEL Pain management  . gabapentin (NEURONTIN) 400 MG capsule Pain management  . ibuprofen (ADVIL,MOTRIN) 200 MG tablet Take by mouth.  . sildenafil (REVATIO) 20 MG tablet sildenafil (pulmonary hypertension) 20 mg tablet  Take 2 tablets by mouth one hour prior to sex as needed. Do not take more than one dose every 24 hours.    PHQ 2/9 Scores 03/24/2019 01/20/2019  PHQ - 2 Score 0 0  PHQ- 9 Score 0 0    BP Readings from Last 3 Encounters:  03/24/19 120/88  02/26/19 (!) 146/90  01/20/19 120/78  Physical Exam Vitals signs and nursing note reviewed.  Constitutional:      General: He is not in acute distress.    Appearance: He is obese. He is not ill-appearing,  toxic-appearing or diaphoretic.  HENT:     Head: Normocephalic.     Jaw: There is normal jaw occlusion.     Salivary Glands: Right salivary gland is not diffusely enlarged or tender. Left salivary gland is not diffusely enlarged or tender.     Right Ear: Hearing, tympanic membrane, ear canal and external ear normal. No decreased hearing noted. No middle ear effusion. There is no impacted cerumen.     Left Ear: Tympanic membrane, ear canal and external ear normal. Decreased hearing noted.  No middle ear effusion. There is no impacted cerumen.     Nose: Nose normal. No congestion or rhinorrhea.     Mouth/Throat:     Lips: Pink.     Mouth: Mucous membranes are moist.     Dentition: Normal dentition.     Tongue: No lesions.     Pharynx: Oropharynx is clear. Uvula midline. No oropharyngeal exudate or posterior oropharyngeal erythema.  Eyes:     General: Lids are normal. Vision grossly intact. Gaze aligned appropriately. No scleral icterus.       Right eye: No discharge.        Left eye: No discharge.     Extraocular Movements: Extraocular movements intact.     Conjunctiva/sclera: Conjunctivae normal.     Pupils: Pupils are equal, round, and reactive to light.     Funduscopic exam:    Right eye: No AV nicking.  Neck:     Musculoskeletal: Full passive range of motion without pain, normal range of motion and neck supple. No neck rigidity or muscular tenderness.     Thyroid: No thyroid mass, thyromegaly or thyroid tenderness.     Vascular: Normal carotid pulses. No carotid bruit, hepatojugular reflux or JVD.     Trachea: Trachea and phonation normal. No tracheal deviation.  Cardiovascular:     Rate and Rhythm: Normal rate and regular rhythm.     Chest Wall: PMI is not displaced. No thrill.     Pulses: Normal pulses. No decreased pulses.          Carotid pulses are 2+ on the right side and 2+ on the left side.      Radial pulses are 2+ on the right side and 2+ on the left side.       Femoral  pulses are 2+ on the right side and 2+ on the left side.      Popliteal pulses are 2+ on the right side and 2+ on the left side.       Dorsalis pedis pulses are 2+ on the right side and 2+ on the left side.       Posterior tibial pulses are 2+ on the right side and 2+ on the left side.     Heart sounds: Normal heart sounds, S1 normal and S2 normal. No murmur. No systolic murmur. No diastolic murmur. No friction rub. No gallop. No S3 or S4 sounds.   Pulmonary:     Effort: Pulmonary effort is normal. No respiratory distress.     Breath sounds: Normal breath sounds. No stridor or decreased air movement. No decreased breath sounds, wheezing, rhonchi or rales.  Chest:     Chest wall: No mass or tenderness.     Breasts: Breasts are symmetrical.  Right: Normal.        Left: Normal.  Abdominal:     General: Abdomen is flat. Bowel sounds are normal. There is no distension.     Palpations: Abdomen is soft. There is no hepatomegaly, splenomegaly or mass.     Tenderness: There is no abdominal tenderness. There is no right CVA tenderness, left CVA tenderness, guarding or rebound.     Hernia: No hernia is present.  Genitourinary:    Pubic Area: No rash.      Penis: Normal.      Scrotum/Testes: Normal.        Right: Mass, tenderness, testicular hydrocele or varicocele not present.        Left: Mass, tenderness, testicular hydrocele or varicocele not present.     Epididymis:     Right: Normal.     Left: Normal.     Prostate: Normal.     Rectum: Normal. Guaiac result negative.  Musculoskeletal: Normal range of motion.        General: No swelling, tenderness, deformity or signs of injury.     Cervical back: Normal.     Thoracic back: Normal.     Lumbar back: Normal.     Right lower leg: No edema.     Left lower leg: No edema.  Feet:     Right foot:     Skin integrity: Skin integrity normal.     Left foot:     Skin integrity: Skin integrity normal.  Lymphadenopathy:     Head:     Right  side of head: No submental, submandibular or tonsillar adenopathy.     Left side of head: No submental, submandibular or tonsillar adenopathy.     Cervical: No cervical adenopathy.     Right cervical: No superficial, deep or posterior cervical adenopathy.    Left cervical: No superficial, deep or posterior cervical adenopathy.  Skin:    General: Skin is warm and dry.     Capillary Refill: Capillary refill takes less than 2 seconds.     Coloration: Skin is not jaundiced or pale.     Findings: No bruising, erythema, lesion or rash.  Neurological:     General: No focal deficit present.     Mental Status: He is alert and oriented to person, place, and time.     Cranial Nerves: Cranial nerves are intact. No cranial nerve deficit.     Sensory: Sensation is intact.     Motor: Motor function is intact.     Deep Tendon Reflexes: Reflexes are normal and symmetric.     Reflex Scores:      Tricep reflexes are 2+ on the right side and 2+ on the left side.      Bicep reflexes are 2+ on the right side and 2+ on the left side.      Brachioradialis reflexes are 2+ on the right side and 2+ on the left side.      Patellar reflexes are 2+ on the right side and 2+ on the left side.      Achilles reflexes are 2+ on the right side and 2+ on the left side.    Wt Readings from Last 3 Encounters:  03/24/19 278 lb (126.1 kg)  02/26/19 270 lb (122.5 kg)  01/20/19 276 lb (125.2 kg)    BP 120/88   Pulse 72   Ht 5\' 11"  (1.803 m)   Wt 278 lb (126.1 kg)   BMI 38.77 kg/m   Assessment and  Plan:  1. Colon cancer screening Is guaiac negative.  We will schedule a gastroenterology referral for colonoscopy in the future. - Ambulatory referral to Gastroenterology  2. Annual physical exam No subjective/objective concerns noted in review of history and physical exam.  Patient's chart was reviewed for previous visits, labs, and recent imaging.Affan Callow Mitter is a 53 y.o. male who presents today for his Complete  Annual Exam. He feels well. He reports exercising . He reports he is sleeping well.  Renal function panel, lipid panel and CBC were performed. - Renal Function Panel - Lipid panel - CBC with Differential/Platelet  3. Class 2 obesity due to excess calories without serious comorbidity with body mass index (BMI) of 38.0 to 38.9 in adult Health risks of being over weight were discussed and patient was counseled on weight loss options and exercise.  Low-cholesterol diet was given.

## 2019-03-24 NOTE — Addendum Note (Signed)
Addended by: Fredderick Severance on: 03/24/2019 04:32 PM   Modules accepted: Orders

## 2019-03-25 LAB — RENAL FUNCTION PANEL
Albumin: 4.6 g/dL (ref 3.8–4.9)
BUN/Creatinine Ratio: 15 (ref 9–20)
BUN: 21 mg/dL (ref 6–24)
CO2: 19 mmol/L — ABNORMAL LOW (ref 20–29)
Calcium: 9.7 mg/dL (ref 8.7–10.2)
Chloride: 103 mmol/L (ref 96–106)
Creatinine, Ser: 1.36 mg/dL — ABNORMAL HIGH (ref 0.76–1.27)
GFR calc Af Amer: 69 mL/min/{1.73_m2} (ref >59)
GFR calc non Af Amer: 59 mL/min/{1.73_m2} — ABNORMAL LOW (ref >59)
Glucose: 104 mg/dL — ABNORMAL HIGH (ref 65–99)
Phosphorus: 3.6 mg/dL (ref 2.8–4.1)
Potassium: 4.5 mmol/L (ref 3.5–5.2)
Sodium: 141 mmol/L (ref 134–144)

## 2019-03-25 LAB — LIPID PANEL
Chol/HDL Ratio: 6.1 ratio — ABNORMAL HIGH (ref 0.0–5.0)
Cholesterol, Total: 261 mg/dL — ABNORMAL HIGH (ref 100–199)
HDL: 43 mg/dL (ref >39)
LDL Calculated: 164 mg/dL — ABNORMAL HIGH (ref 0–99)
Triglycerides: 270 mg/dL — ABNORMAL HIGH (ref 0–149)
VLDL Cholesterol Cal: 54 mg/dL — ABNORMAL HIGH (ref 5–40)

## 2019-03-25 LAB — CBC WITH DIFFERENTIAL/PLATELET
Basophils Absolute: 0.1 10*3/uL (ref 0.0–0.2)
Basos: 1 %
EOS (ABSOLUTE): 0.3 10*3/uL (ref 0.0–0.4)
Eos: 4 %
Hematocrit: 44.2 % (ref 37.5–51.0)
Hemoglobin: 15 g/dL (ref 13.0–17.7)
Immature Grans (Abs): 0 10*3/uL (ref 0.0–0.1)
Immature Granulocytes: 1 %
Lymphocytes Absolute: 2 10*3/uL (ref 0.7–3.1)
Lymphs: 25 %
MCH: 29.5 pg (ref 26.6–33.0)
MCHC: 33.9 g/dL (ref 31.5–35.7)
MCV: 87 fL (ref 79–97)
Monocytes Absolute: 0.5 10*3/uL (ref 0.1–0.9)
Monocytes: 6 %
Neutrophils Absolute: 5.2 10*3/uL (ref 1.4–7.0)
Neutrophils: 63 %
Platelets: 308 10*3/uL (ref 150–450)
RBC: 5.09 x10E6/uL (ref 4.14–5.80)
RDW: 14.5 % (ref 11.6–15.4)
WBC: 8.1 10*3/uL (ref 3.4–10.8)

## 2019-04-02 ENCOUNTER — Ambulatory Visit (INDEPENDENT_AMBULATORY_CARE_PROVIDER_SITE_OTHER): Payer: 59 | Admitting: Physical Medicine and Rehabilitation

## 2019-04-02 ENCOUNTER — Other Ambulatory Visit: Payer: Self-pay

## 2019-04-02 ENCOUNTER — Ambulatory Visit: Payer: Self-pay

## 2019-04-02 ENCOUNTER — Encounter: Payer: Self-pay | Admitting: Physical Medicine and Rehabilitation

## 2019-04-02 VITALS — BP 158/94 | HR 72

## 2019-04-02 DIAGNOSIS — M5412 Radiculopathy, cervical region: Secondary | ICD-10-CM

## 2019-04-02 MED ORDER — METHYLPREDNISOLONE ACETATE 80 MG/ML IJ SUSP
80.0000 mg | Freq: Once | INTRAMUSCULAR | Status: AC
  Administered 2019-04-02: 80 mg

## 2019-04-02 NOTE — Procedures (Signed)
Cervical Epidural Steroid Injection - Interlaminar Approach with Fluoroscopic Guidance  Patient: Adam Hernandez      Date of Birth: 05/20/1966 MRN: [id number] PCP: Juline Patch, MD      Visit Date: 04/02/2019   Universal Protocol:    Date/Time: 04/02/2011:50 PM  Consent Given By: the patient  Position: PRONE  Additional Comments: Vital signs were monitored before and after the procedure. Patient was prepped and draped in the usual sterile fashion. The correct patient, procedure, and site was verified.   Injection Procedure Details:  Procedure Site One Meds Administered:  Meds ordered this encounter  Medications  . methylPREDNISolone acetate (DEPO-MEDROL) injection 80 mg     Laterality: Left  Location/Site: C7-T1  Needle size: 20 G  Needle type: Touhy  Needle Placement: Paramedian epidural space  Findings:  -Comments: Excellent flow of contrast into the epidural space.  Procedure Details: Using a paramedian approach from the side mentioned above, the region overlying the inferior lamina was localized under fluoroscopic visualization and the soft tissues overlying this structure were infiltrated with 4 ml. of 1% Lidocaine without Epinephrine. A # 20 gauge, Tuohy needle was inserted into the epidural space using a paramedian approach.  The epidural space was localized using loss of resistance along with lateral and contralateral oblique bi-planar fluoroscopic views.  After negative aspirate for air, blood, and CSF, a 2 ml. volume of Isovue-250 was injected into the epidural space and the flow of contrast was observed. Radiographs were obtained for documentation purposes.   The injectate was administered into the level noted above.  Additional Comments:  The patient tolerated the procedure well Dressing: 2 x 2 sterile gauze and Band-Aid    Post-procedure details: Patient was observed during the procedure. Post-procedure instructions were reviewed.  Patient  left the clinic in stable condition.

## 2019-04-02 NOTE — Progress Notes (Signed)
 .  Numeric Pain Rating Scale and Functional Assessment Average Pain 6   In the last MONTH (on 0-10 scale) has pain interfered with the following?  1. General activity like being  able to carry out your everyday physical activities such as walking, climbing stairs, carrying groceries, or moving a chair?  Rating(6)   +Driver, -BT, -Dye Allergies.  

## 2019-04-02 NOTE — Progress Notes (Signed)
Adam Hernandez - 53 y.o. male MRN [id number]  Date of birth: 1966/07/09  Office Visit Note: Visit Date: 04/02/2019 PCP: Juline Patch, MD Referred by: Juline Patch, MD  Subjective: Chief Complaint  Patient presents with  . Neck - Pain   HPI:  Adam Hernandez is a 53 y.o. male who comes in today At the request of Dr. Dr. [name] for left C7-T1 interlaminar epidural steroid injection.  Patient is having chronic worsening severe neck and left shoulder and arm pain.  S central canal stenosis C5-6 and some foraminal narrowing at other places.  He is failed conservative care otherwise.  ROS Otherwise per HPI.  Assessment & Plan: Visit Diagnoses:  1. Cervical radiculopathy     Plan: No additional findings.   Meds & Orders:  Meds ordered this encounter  Medications  . methylPREDNISolone acetate (DEPO-MEDROL) injection 80 mg    Orders Placed This Encounter  Procedures  . XR C-ARM NO REPORT  . Epidural Steroid injection    Follow-up: Return if symptoms worsen or fail to improve.   Procedures: No procedures performed  Cervical Epidural Steroid Injection - Interlaminar Approach with Fluoroscopic Guidance  Patient: Adam Hernandez      Date of Birth: [birth year] MRN: [id number] PCP: Juline Patch, MD      Visit Date: 04/02/2019   Universal Protocol:    Date/Time: 04/02/2011:50 PM  Consent Given By: the patient  Position: PRONE  Additional Comments: Vital signs were monitored before and after the procedure. Patient was prepped and draped in the usual sterile fashion. The correct patient, procedure, and site was verified.   Injection Procedure Details:  Procedure Site One Meds Administered:  Meds ordered this encounter  Medications  . methylPREDNISolone acetate (DEPO-MEDROL) injection 80 mg     Laterality: Left  Location/Site: C7-T1  Needle size: 20 G  Needle type: Touhy  Needle Placement: Paramedian epidural space  Findings:  -Comments:  Excellent flow of contrast into the epidural space.  Procedure Details: Using a paramedian approach from the side mentioned above, the region overlying the inferior lamina was localized under fluoroscopic visualization and the soft tissues overlying this structure were infiltrated with 4 ml. of 1% Lidocaine without Epinephrine. A # 20 gauge, Tuohy needle was inserted into the epidural space using a paramedian approach.  The epidural space was localized using loss of resistance along with lateral and contralateral oblique bi-planar fluoroscopic views.  After negative aspirate for air, blood, and CSF, a 2 ml. volume of Isovue-250 was injected into the epidural space and the flow of contrast was observed. Radiographs were obtained for documentation purposes.   The injectate was administered into the level noted above.  Additional Comments:  The patient tolerated the procedure well Dressing: 2 x 2 sterile gauze and Band-Aid    Post-procedure details: Patient was observed during the procedure. Post-procedure instructions were reviewed.  Patient left the clinic in stable condition.    Clinical History: MRI CERVICAL SPINE WITHOUT CONTRAST  TECHNIQUE: Multiplanar, multisequence MR imaging of the cervical spine was performed. No intravenous contrast was administered.  COMPARISON:  Cervical radiographs 01/15/2019  FINDINGS: Alignment: Normal  Vertebrae: Normal bone marrow.  Negative for fracture or mass  Cord: No cord signal abnormality identified. Cord evaluation limited by motion  Posterior Fossa, vertebral arteries, paraspinal tissues: Negative  Disc levels:  C2-3: Negative  C3-4: Negative  C4-5: Left foraminal encroachment due to spurring with expected impingement left C5 nerve root. Spinal canal and  right foramen patent  C5-6: Moderate disc degeneration with diffuse uncinate spurring. Moderate right foraminal encroachment and severe left foraminal  encroachment. Cord flattening with moderate spinal stenosis.  C6-7: Right paracentral disc protrusion with mild right foraminal encroachment. Cord flattening and mild spinal stenosis on the right  C7-T1: Bilateral facet degeneration contributing to mild foraminal narrowing bilaterally.  IMPRESSION: Left foraminal encroachment C4-5  Moderate right foraminal encroachment and severe left foraminal encroachment C5-6 due to spurring. Moderate spinal stenosis C5-6  Right sided disc protrusion at C6-7 with mild right foraminal encroachment  Mild foraminal narrowing bilaterally C7-T1.   Electronically Signed   By: Franchot Gallo M.D.   On: 02/19/2019 10:43     Objective:  VS:  HT:    WT:   BMI:     BP:(!) 158/94  HR:72bpm  TEMP: ( )  RESP:97 % Physical Exam  Ortho Exam Imaging: Xr C-arm No Report  Result Date: 04/02/2019 Please see Notes tab for imaging impression.

## 2019-04-11 ENCOUNTER — Telehealth: Payer: Self-pay | Admitting: *Deleted

## 2019-04-11 ENCOUNTER — Telehealth: Payer: Self-pay

## 2019-04-11 DIAGNOSIS — Z20822 Contact with and (suspected) exposure to covid-19: Secondary | ICD-10-CM

## 2019-04-11 NOTE — Telephone Encounter (Signed)
I coming call from Pt.  Offered Covid testing  To pt.  Pt.  Schedule for Monday 04/13/19 at 1000am at [facility].  Patient voiced understanding.

## 2019-04-11 NOTE — Telephone Encounter (Signed)
Attempted to contact pt regarding recent orthopedic visit; left message on voicemail for pt to call back.

## 2019-04-13 ENCOUNTER — Other Ambulatory Visit: Payer: 59

## 2019-04-13 ENCOUNTER — Other Ambulatory Visit: Payer: Self-pay | Admitting: Hematology

## 2019-04-13 DIAGNOSIS — Z20822 Contact with and (suspected) exposure to covid-19: Secondary | ICD-10-CM

## 2019-04-13 DIAGNOSIS — R6889 Other general symptoms and signs: Secondary | ICD-10-CM | POA: Diagnosis not present

## 2019-04-13 NOTE — Progress Notes (Signed)
COVID ordered released in error.  Re-ordered screening. /

## 2019-04-14 LAB — NOVEL CORONAVIRUS, NAA: SARS-CoV-2, NAA: NOT DETECTED

## 2019-04-20 ENCOUNTER — Telehealth: Payer: Self-pay | Admitting: Gastroenterology

## 2019-04-20 ENCOUNTER — Other Ambulatory Visit: Payer: Self-pay

## 2019-04-20 DIAGNOSIS — Z1211 Encounter for screening for malignant neoplasm of colon: Secondary | ICD-10-CM

## 2019-04-20 MED ORDER — NA SULFATE-K SULFATE-MG SULF 17.5-3.13-1.6 GM/177ML PO SOLN: 1.0000 | Freq: Once | ORAL | 0 refills | Status: AC

## 2019-04-20 NOTE — Telephone Encounter (Signed)
Gastroenterology Pre-Procedure Review  Request Date: 04/30/19 Requesting Physician: Dr. Allen Norris  PATIENT REVIEW QUESTIONS: The patient responded to the following health history questions as indicated:    1. Are you having any GI issues? No 2. Do you have a personal history of Polyps? No 3. Do you have a family history of Colon Cancer or Polyps?No 4. Diabetes Mellitus? No 5. Joint replacements in the past 12 months?No 6. Major health problems in the past 3 months?No 7. Any artificial heart valves, MVP, or defibrillator?No    MEDICATIONS & ALLERGIES:    Patient reports the following regarding taking any anticoagulation/antiplatelet therapy:   Plavix, Coumadin, Eliquis, Xarelto, Lovenox, Pradaxa, Brilinta, or Effient? No Aspirin? No  Patient confirms/reports the following medications:  Current Outpatient Medications  Medication Sig Dispense Refill  . acetaminophen (TYLENOL) 500 MG tablet Take by mouth.    . celecoxib (CELEBREX) 200 MG capsule Celebrex 200 mg capsule  Take 1 capsule every day by oral route.    . diclofenac sodium (VOLTAREN) 1 % GEL Pain management    . gabapentin (NEURONTIN) 400 MG capsule Pain management    . ibuprofen (ADVIL,MOTRIN) 200 MG tablet Take by mouth.    . Na Sulfate-K Sulfate-Mg Sulf 17.5-3.13-1.6 GM/177ML SOLN Take 1 kit by mouth once for 1 dose. 354 mL 0  . omeprazole (PRILOSEC) 20 MG capsule Take 20 mg by mouth as needed.     . sildenafil (REVATIO) 20 MG tablet sildenafil (pulmonary hypertension) 20 mg tablet  Take 2 tablets by mouth one hour prior to sex as needed. Do not take more than one dose every 24 hours.     No current facility-administered medications for this visit.     Patient confirms/reports the following allergies:  No Known Allergies  No orders of the defined types were placed in this encounter.   AUTHORIZATION INFORMATION Primary Insurance: 1D#: Group #:  Secondary Insurance: 1D#: Group #:  SCHEDULE INFORMATION: Date:  04/30/19 Time: Location:[facility]

## 2019-04-20 NOTE — Telephone Encounter (Signed)
Patient called In & l/m on v/m stating he is returning Michelle's call to schedule a colonoscopy.

## 2019-04-27 ENCOUNTER — Other Ambulatory Visit
Admission: RE | Admit: 2019-04-27 | Discharge: 2019-04-27 | Disposition: A | Discharge status: Home / Self Care | Payer: 59 | Source: Ambulatory Visit | Attending: Gastroenterology | Admitting: Gastroenterology

## 2019-04-27 ENCOUNTER — Encounter: Payer: Self-pay | Admitting: *Deleted

## 2019-04-27 ENCOUNTER — Other Ambulatory Visit: Payer: Self-pay

## 2019-04-27 DIAGNOSIS — Z1159 Encounter for screening for other viral diseases: Secondary | ICD-10-CM | POA: Insufficient documentation

## 2019-04-28 LAB — NOVEL CORONAVIRUS, NAA (HOSP ORDER, SEND-OUT TO REF LAB; TAT 18-24 HRS): SARS-CoV-2, NAA: NOT DETECTED

## 2019-04-29 NOTE — Discharge Instructions (Signed)
General Anesthesia, Adult, Care After  This sheet gives you information about how to care for yourself after your procedure. Your health care provider may also give you more specific instructions. If you have problems or questions, contact your health care provider.  What can I expect after the procedure?  After the procedure, the following side effects are common:  Pain or discomfort at the IV site.  Nausea.  Vomiting.  Sore throat.  Trouble concentrating.  Feeling cold or chills.  Weak or tired.  Sleepiness and fatigue.  Soreness and body aches. These side effects can affect parts of the body that were not involved in surgery.  Follow these instructions at home:    For at least 24 hours after the procedure:  Have a responsible adult stay with you. It is important to have someone help care for you until you are awake and alert.  Rest as needed.  Do not:  Participate in activities in which you could fall or become injured.  Drive.  Use heavy machinery.  Drink alcohol.  Take sleeping pills or medicines that cause drowsiness.  Make important decisions or sign legal documents.  Take care of children on your own.  Eating and drinking  Follow any instructions from your health care provider about eating or drinking restrictions.  When you feel hungry, start by eating small amounts of foods that are soft and easy to digest (bland), such as toast. Gradually return to your regular diet.  Drink enough fluid to keep your urine pale yellow.  If you vomit, rehydrate by drinking water, juice, or clear broth.  General instructions  If you have sleep apnea, surgery and certain medicines can increase your risk for breathing problems. Follow instructions from your health care provider about wearing your sleep device:  Anytime you are sleeping, including during daytime naps.  While taking prescription pain medicines, sleeping medicines, or medicines that make you drowsy.  Return to your normal activities as told by your health care  provider. Ask your health care provider what activities are safe for you.  Take over-the-counter and prescription medicines only as told by your health care provider.  If you smoke, do not smoke without supervision.  Keep all follow-up visits as told by your health care provider. This is important.  Contact a health care provider if:  You have nausea or vomiting that does not get better with medicine.  You cannot eat or drink without vomiting.  You have pain that does not get better with medicine.  You are unable to pass urine.  You develop a skin rash.  You have a fever.  You have redness around your IV site that gets worse.  Get help right away if:  You have difficulty breathing.  You have chest pain.  You have blood in your urine or stool, or you vomit blood.  Summary  After the procedure, it is common to have a sore throat or nausea. It is also common to feel tired.  Have a responsible adult stay with you for the first 24 hours after general anesthesia. It is important to have someone help care for you until you are awake and alert.  When you feel hungry, start by eating small amounts of foods that are soft and easy to digest (bland), such as toast. Gradually return to your regular diet.  Drink enough fluid to keep your urine pale yellow.  Return to your normal activities as told by your health care provider. Ask your health care   provider what activities are safe for you.  This information is not intended to replace advice given to you by your health care provider. Make sure you discuss any questions you have with your health care provider.  Document Released: 02/04/2001 Document Revised: 06/14/2017 Document Reviewed: 06/14/2017  [employer]vier Interactive Patient Education  2019 [employer]

## 2019-04-30 ENCOUNTER — Encounter: Payer: Self-pay | Admitting: Anesthesiology

## 2019-04-30 ENCOUNTER — Ambulatory Visit: Payer: 59 | Admitting: Anesthesiology

## 2019-04-30 ENCOUNTER — Ambulatory Visit
Admission: RE | Admit: 2019-04-30 | Discharge: 2019-04-30 | Disposition: A | Discharge status: Home / Self Care | Payer: 59 | Attending: Gastroenterology | Admitting: Gastroenterology

## 2019-04-30 ENCOUNTER — Encounter
Admission: RE | Disposition: A | Discharge status: Home / Self Care | Payer: Self-pay | Source: Home / Self Care | Attending: Gastroenterology

## 2019-04-30 DIAGNOSIS — K640 First degree hemorrhoids: Secondary | ICD-10-CM | POA: Insufficient documentation

## 2019-04-30 DIAGNOSIS — K635 Polyp of colon: Secondary | ICD-10-CM

## 2019-04-30 DIAGNOSIS — Z791 Long term (current) use of non-steroidal anti-inflammatories (NSAID): Secondary | ICD-10-CM | POA: Diagnosis not present

## 2019-04-30 DIAGNOSIS — K219 Gastro-esophageal reflux disease without esophagitis: Secondary | ICD-10-CM | POA: Diagnosis not present

## 2019-04-30 DIAGNOSIS — D125 Benign neoplasm of sigmoid colon: Secondary | ICD-10-CM | POA: Insufficient documentation

## 2019-04-30 DIAGNOSIS — E669 Obesity, unspecified: Secondary | ICD-10-CM | POA: Diagnosis not present

## 2019-04-30 DIAGNOSIS — Z1211 Encounter for screening for malignant neoplasm of colon: Secondary | ICD-10-CM | POA: Diagnosis not present

## 2019-04-30 DIAGNOSIS — Z6835 Body mass index (BMI) 35.0-35.9, adult: Secondary | ICD-10-CM | POA: Diagnosis not present

## 2019-04-30 DIAGNOSIS — M1990 Unspecified osteoarthritis, unspecified site: Secondary | ICD-10-CM | POA: Insufficient documentation

## 2019-04-30 HISTORY — PX: COLONOSCOPY WITH PROPOFOL: SHX5780

## 2019-04-30 HISTORY — DX: Motion sickness, initial encounter: T75.3XXA

## 2019-04-30 HISTORY — DX: Gastro-esophageal reflux disease without esophagitis: K21.9

## 2019-04-30 HISTORY — DX: Anesthesia of skin: R20.0

## 2019-04-30 HISTORY — PX: POLYPECTOMY: SHX5525

## 2019-04-30 SURGERY — COLONOSCOPY WITH PROPOFOL
Anesthesia: General | Site: Rectum

## 2019-04-30 MED ORDER — SODIUM CHLORIDE 0.9 % IV SOLN: INTRAVENOUS | Status: DC

## 2019-04-30 MED ORDER — LACTATED RINGERS IV SOLN
INTRAVENOUS | Status: DC
  Administered 2019-04-30: 09:00:00 via INTRAVENOUS

## 2019-04-30 MED ORDER — LIDOCAINE HCL (CARDIAC) PF 100 MG/5ML IV SOSY
PREFILLED_SYRINGE | INTRAVENOUS | Status: DC | PRN
  Administered 2019-04-30: 40 mg via INTRAVENOUS

## 2019-04-30 MED ORDER — PROPOFOL 10 MG/ML IV BOLUS
INTRAVENOUS | Status: DC | PRN
  Administered 2019-04-30 (×4): 50 mg via INTRAVENOUS
  Administered 2019-04-30: 100 mg via INTRAVENOUS
  Administered 2019-04-30 (×3): 50 mg via INTRAVENOUS

## 2019-04-30 MED ORDER — ONDANSETRON HCL 4 MG/2ML IJ SOLN: 4.0000 mg | Freq: Once | INTRAMUSCULAR | Status: DC | PRN

## 2019-04-30 MED ORDER — STERILE WATER FOR IRRIGATION IR SOLN
Status: DC | PRN
  Administered 2019-04-30: 15 mL

## 2019-04-30 SURGICAL SUPPLY — 6 items
CANISTER SUCT 1200ML W/VALVE (MISCELLANEOUS) ×3 IMPLANT
FORCEPS BIOP RAD 4 LRG CAP 4 (CUTTING FORCEPS) ×3 IMPLANT
GOWN CVR UNV OPN BCK APRN NK (MISCELLANEOUS) ×2 IMPLANT
GOWN ISOL THUMB LOOP REG UNIV (MISCELLANEOUS) ×4
KIT ENDO PROCEDURE OLY (KITS) ×3 IMPLANT
WATER STERILE IRR 250ML POUR (IV SOLUTION) ×3 IMPLANT

## 2019-04-30 NOTE — Anesthesia Preprocedure Evaluation (Signed)
Anesthesia Evaluation  Patient identified by MRN, date of birth, ID band Patient awake    Reviewed: Allergy & Precautions, NPO status , Patient's Chart, lab work & pertinent test results  Airway Mallampati: II  TM Distance: >3 FB     Dental   Pulmonary    breath sounds clear to auscultation       Cardiovascular negative cardio ROS   Rhythm:Regular Rate:Normal     Neuro/Psych Anxiety    GI/Hepatic GERD  ,  Endo/Other  Obesity - BMI 36  Renal/GU      Musculoskeletal  (+) Arthritis ,   Abdominal   Peds  Hematology   Anesthesia Other Findings   Reproductive/Obstetrics                             Anesthesia Physical Anesthesia Plan  ASA: II  Anesthesia Plan: General   Post-op Pain Management:    Induction: Intravenous  PONV Risk Score and Plan:   Airway Management Planned: Nasal Cannula and Natural Airway  Additional Equipment:   Intra-op Plan:   Post-operative Plan:   Informed Consent:   Plan Discussed with:   Anesthesia Plan Comments:         Anesthesia Quick Evaluation

## 2019-04-30 NOTE — Anesthesia Procedure Notes (Signed)
Procedure Name: MAC Date/Time: 04/30/2019 9:19 AM Performed by: Janna Arch, CRNA Pre-anesthesia Checklist: Patient identified, Emergency Drugs available, Suction available, Timeout performed and Patient being monitored Patient Re-evaluated:Patient Re-evaluated prior to induction Oxygen Delivery Method: Nasal cannula Placement Confirmation: positive ETCO2

## 2019-04-30 NOTE — Transfer of Care (Signed)
Immediate Anesthesia Transfer of Care Note  Patient: Adam Hernandez  Procedure(s) Performed: COLONOSCOPY WITH BIOPSY (N/A Rectum) POLYPECTOMY (N/A Rectum)  Patient Location: PACU  Anesthesia Type: General  Level of Consciousness: awake, alert  and patient cooperative  Airway and Oxygen Therapy: Patient Spontanous Breathing and Patient connected to supplemental oxygen  Post-op Assessment: Post-op Vital signs reviewed, Patient's Cardiovascular Status Stable, Respiratory Function Stable, Patent Airway and No signs of Nausea or vomiting  Post-op Vital Signs: Reviewed and stable  Complications: No apparent anesthesia complications

## 2019-04-30 NOTE — Op Note (Signed)
[facility] Patient Name: Adam Hernandez Procedure Date: 04/30/2019 9:16 AM MRN: [id number] Account #: [id number] Date of Birth: 1966-01-24 Admit Type: Outpatient Age: 53 Room: [facility] OR ROOM 01 Gender: Male Note Status: Finalized Procedure:            Colonoscopy Indications:          Screening for colorectal malignant neoplasm Providers:            Lucilla Lame MD, MD Referring MD:         Juline Patch, MD (Referring MD) Medicines:            Propofol per Anesthesia Complications:        No immediate complications. Procedure:            Pre-Anesthesia Assessment:                       - Prior to the procedure, a History and Physical was                        performed, and patient medications and allergies were                        reviewed. The patient's tolerance of previous                        anesthesia was also reviewed. The risks and benefits of                        the procedure and the sedation options and risks were                        discussed with the patient. All questions were                        answered, and informed consent was obtained. Prior                        Anticoagulants: The patient has taken no previous                        anticoagulant or antiplatelet agents. ASA Grade                        Assessment: II - A patient with mild systemic disease.                        After reviewing the risks and benefits, the patient was                        deemed in satisfactory condition to undergo the                        procedure.                       After obtaining informed consent, the colonoscope was                        passed under direct vision. Throughout the procedure,  the patient's blood pressure, pulse, and oxygen                        saturations were monitored continuously. The                        Colonoscope was introduced through the anus and                         advanced to the the cecum, identified by appendiceal                        orifice and ileocecal valve. The colonoscopy was                        performed without difficulty. The patient tolerated the                        procedure well. The quality of the bowel preparation                        was excellent. Findings:      The perianal and digital rectal examinations were normal.      Three sessile polyps were found in the sigmoid colon. The polyps were 2       to 3 mm in size. These polyps were removed with a cold biopsy forceps.       Resection and retrieval were complete.      Non-bleeding internal hemorrhoids were found during retroflexion. The       hemorrhoids were Grade I (internal hemorrhoids that do not prolapse). Impression:           - Three 2 to 3 mm polyps in the sigmoid colon, removed                        with a cold biopsy forceps. Resected and retrieved.                       - Non-bleeding internal hemorrhoids. Recommendation:       - Discharge patient to home.                       - Resume previous diet.                       - Continue present medications.                       - Await pathology results.                       - Repeat colonoscopy in 5 years if polyp adenoma and 10                        years if hyperplastic Procedure Code(s):    --- Professional ---                       [phone], Colonoscopy, flexible; with biopsy, single or                        multiple Diagnosis Code(s):    ---  Professional ---                       Z12.11, Encounter for screening for malignant neoplasm                        of colon                       K63.5, Polyp of colon CPT copyright 2019 American Medical Association. All rights reserved. The codes documented in this report are preliminary and upon coder review may  be revised to meet current compliance requirements. Lucilla Lame MD, MD 04/30/2019 9:40:28 AM This report has been signed  electronically. Number of Addenda: 0 Note Initiated On: 04/30/2019 9:16 AM Scope Withdrawal Time: 0 hours 9 minutes 15 seconds  Total Procedure Duration: 0 hours 15 minutes 55 seconds  Estimated Blood Loss: Estimated blood loss: none.      [facility]

## 2019-04-30 NOTE — H&P (Signed)
Adam Lame, MD [address] [place], [place] [address] Phone: [phone] Fax : [phone]  Primary Care Physician:  Juline Patch, MD Primary Gastroenterologist:  Dr. Allen Norris  Pre-Procedure History & Physical: HPI:  Adam Hernandez is a 53 y.o. male is here for a screening colonoscopy.   Past Medical History:  Diagnosis Date  . Anxiety   . Arm numbness left    cervical disc issues  . Arthritis    right leg  . GERD (gastroesophageal reflux disease)   . Motion sickness    ocean boats  . Obesity (BMI 35.0-39.9 without comorbidity) 09/02/2017    Past Surgical History:  Procedure Laterality Date  . LEG SURGERY Right 2017, 2018   fractured leg and ankle- has screws and pins  . SHOULDER SURGERY Left 2013   labrial tear    Prior to Admission medications   Medication Sig Start Date End Date Taking? Authorizing Provider  acetaminophen (TYLENOL) 500 MG tablet Take by mouth.   Yes [provider]  Ascorbic Acid (VITAMIN C PO) Take by mouth daily.   Yes [provider]  celecoxib (CELEBREX) 200 MG capsule Celebrex 200 mg capsule  Take 1 capsule every day by oral route.   Yes [provider]  diclofenac sodium (VOLTAREN) 1 % GEL Pain management   Yes [provider]  gabapentin (NEURONTIN) 400 MG capsule Pain management   Yes [provider]  ibuprofen (ADVIL,MOTRIN) 200 MG tablet Take by mouth.   Yes [provider]  omeprazole (PRILOSEC) 20 MG capsule Take 20 mg by mouth as needed.    Yes [provider]  sildenafil (REVATIO) 20 MG tablet sildenafil (pulmonary hypertension) 20 mg tablet  Take 2 tablets by mouth one hour prior to sex as needed. Do not take more than one dose every 24 hours.   Yes [provider]    Allergies as of 04/20/2019  . (No Known Allergies)    Family History  Adopted: Yes    Social History   Socioeconomic History  . Marital status: Married    Spouse  name: Not on file  . Number of children: Not on file  . Years of education: Not on file  . Highest education level: Not on file  Occupational History  . Not on file  Social Needs  . Financial resource strain: Not on file  . Food insecurity    Worry: Not on file    Inability: Not on file  . Transportation needs    Medical: Not on file    Non-medical: Not on file  Tobacco Use  . Smoking status: Never Smoker  . Smokeless tobacco: Never Used  Substance and Sexual Activity  . Alcohol use: Yes    Alcohol/week: 1.0 standard drinks    Types: 1 Cans of beer per week    Comment:    . Drug use: Never  . Sexual activity: Yes  Lifestyle  . Physical activity    Days per week: Not on file    Minutes per session: Not on file  . Stress: Not on file  Relationships  . Social Herbalist on phone: Not on file    Gets together: Not on file    Attends religious service: Not on file    Active member of club or organization: Not on file    Attends meetings of clubs or organizations: Not on file    Relationship status: Not on file  .  Intimate partner violence    Fear of current or ex partner: Not on file    Emotionally abused: Not on file    Physically abused: Not on file    Forced sexual activity: Not on file  Other Topics Concern  . Not on file  Social History Narrative  . Not on file    Review of Systems: See HPI, otherwise negative ROS  Physical Exam: BP 126/83   Pulse 82   Temp 99 F (37.2 C) (Temporal)   Resp 16   Ht 6' (1.829 m)   Wt 118.8 kg   SpO2 97%   BMI 35.53 kg/m  General:   Alert,  pleasant and cooperative in NAD Head:  Normocephalic and atraumatic. Neck:  Supple; no masses or thyromegaly. Lungs:  Clear throughout to auscultation.    Heart:  Regular rate and rhythm. Abdomen:  Soft, nontender and nondistended. Normal bowel sounds, without guarding, and without rebound.   Neurologic:  Alert and  oriented x4;  grossly normal neurologically.   Impression/Plan: Adam Hernandez is now here to undergo a screening colonoscopy.  Risks, benefits, and alternatives regarding colonoscopy have been reviewed with the patient.  Questions have been answered.  All parties agreeable.

## 2019-04-30 NOTE — Anesthesia Postprocedure Evaluation (Signed)
Anesthesia Post Note  Patient: Adam Hernandez  Procedure(s) Performed: COLONOSCOPY WITH BIOPSY (N/A Rectum) POLYPECTOMY (N/A Rectum)  Patient location during evaluation: PACU Anesthesia Type: General Level of consciousness: awake Pain management: pain level controlled Vital Signs Assessment: post-procedure vital signs reviewed and stable Respiratory status: respiratory function stable Cardiovascular status: stable Postop Assessment: no signs of nausea or vomiting Anesthetic complications: no    Veda Canning

## 2019-05-01 ENCOUNTER — Encounter: Payer: Self-pay | Admitting: Gastroenterology

## 2019-05-01 ENCOUNTER — Encounter (INDEPENDENT_AMBULATORY_CARE_PROVIDER_SITE_OTHER): Payer: Self-pay | Admitting: Specialist

## 2019-05-04 ENCOUNTER — Encounter: Payer: Self-pay | Admitting: Gastroenterology

## 2019-05-04 LAB — SURGICAL PATHOLOGY

## 2019-05-06 ENCOUNTER — Ambulatory Visit (INDEPENDENT_AMBULATORY_CARE_PROVIDER_SITE_OTHER): Payer: 59 | Admitting: Physical Medicine and Rehabilitation

## 2019-05-06 ENCOUNTER — Ambulatory Visit: Payer: Self-pay

## 2019-05-06 ENCOUNTER — Other Ambulatory Visit: Payer: Self-pay

## 2019-05-06 ENCOUNTER — Encounter: Payer: Self-pay | Admitting: Physical Medicine and Rehabilitation

## 2019-05-06 VITALS — BP 156/103 | HR 62

## 2019-05-06 DIAGNOSIS — M5412 Radiculopathy, cervical region: Secondary | ICD-10-CM

## 2019-05-06 MED ORDER — METHYLPREDNISOLONE ACETATE 80 MG/ML IJ SUSP: 80.0000 mg | Freq: Once | INTRAMUSCULAR | Status: DC

## 2019-05-06 NOTE — Progress Notes (Signed)
Numeric Pain Rating Scale and Functional Assessment Average Pain 4   In the last MONTH (on 0-10 scale) has pain interfered with the following?  1. General activity like being  able to carry out your everyday physical activities such as walking, climbing stairs, carrying groceries, or moving a chair?  Rating(5)    +Driver, -BT, -Dye Allergies.  

## 2019-05-14 NOTE — Procedures (Signed)
Cervical Epidural Steroid Injection - Interlaminar Approach with Fluoroscopic Guidance  Patient: Adam Hernandez      Date of Birth: 05-20-1966 MRN: [id number] PCP: Juline Patch, MD      Visit Date: 05/06/2019   Universal Protocol:    Date/Time: 05/13/2009:31 PM  Consent Given By: the patient  Position: PRONE  Additional Comments: Vital signs were monitored before and after the procedure. Patient was prepped and draped in the usual sterile fashion. The correct patient, procedure, and site was verified.   Injection Procedure Details:  Procedure Site One Meds Administered:  Meds ordered this encounter  Medications  . methylPREDNISolone acetate (DEPO-MEDROL) injection 80 mg     Laterality: Left  Location/Site: C7-T1  Needle size: 20 G  Needle type: Touhy  Needle Placement: Paramedian epidural space  Findings:  -Comments: Excellent flow of contrast into the epidural space.  Procedure Details: Using a paramedian approach from the side mentioned above, the region overlying the inferior lamina was localized under fluoroscopic visualization and the soft tissues overlying this structure were infiltrated with 4 ml. of 1% Lidocaine without Epinephrine. A # 20 gauge, Tuohy needle was inserted into the epidural space using a paramedian approach.  The epidural space was localized using loss of resistance along with lateral and contralateral oblique bi-planar fluoroscopic views.  After negative aspirate for air, blood, and CSF, a 2 ml. volume of Isovue-250 was injected into the epidural space and the flow of contrast was observed. Radiographs were obtained for documentation purposes.   The injectate was administered into the level noted above.  Additional Comments:  The patient tolerated the procedure well Dressing: 2 x 2 sterile gauze and Band-Aid    Post-procedure details: Patient was observed during the procedure. Post-procedure instructions were reviewed.  Patient  left the clinic in stable condition.

## 2019-05-14 NOTE — Progress Notes (Signed)
Adam Hernandez - 53 y.o. male MRN [id number]  Date of birth: [birth year]  Office Visit Note: Visit Date: 05/06/2019 PCP: Juline Patch, MD Referred by: Juline Patch, MD  Subjective: Chief Complaint  Patient presents with  . Neck - Pain   HPI:  Adam Hernandez is a 53 y.o. male who comes in today Planned repeat left C7-T1 interlaminar epidural steroid injection.  Patient got good relief with prior injection in May and is still having some relief from the injection just not where he like to be.  He has pretty significant cervical stenosis and radicular pain.  We will repeat the injection today and see how he does.  ROS Otherwise per HPI.  Assessment & Plan: Visit Diagnoses:  1. Cervical radiculopathy     Plan: No additional findings.   Meds & Orders:  Meds ordered this encounter  Medications  . methylPREDNISolone acetate (DEPO-MEDROL) injection 80 mg    Orders Placed This Encounter  Procedures  . XR C-ARM NO REPORT  . Epidural Steroid injection    Follow-up: No follow-ups on file.   Procedures: No procedures performed  Cervical Epidural Steroid Injection - Interlaminar Approach with Fluoroscopic Guidance  Patient: Adam Hernandez      Date of Birth: [birth year] MRN: [id number] PCP: Juline Patch, MD      Visit Date: 05/06/2019   Universal Protocol:    Date/Time: 05/13/2009:31 PM  Consent Given By: the patient  Position: PRONE  Additional Comments: Vital signs were monitored before and after the procedure. Patient was prepped and draped in the usual sterile fashion. The correct patient, procedure, and site was verified.   Injection Procedure Details:  Procedure Site One Meds Administered:  Meds ordered this encounter  Medications  . methylPREDNISolone acetate (DEPO-MEDROL) injection 80 mg     Laterality: Left  Location/Site: C7-T1  Needle size: 20 G  Needle type: Touhy  Needle Placement: Paramedian epidural space  Findings:  -Comments:  Excellent flow of contrast into the epidural space.  Procedure Details: Using a paramedian approach from the side mentioned above, the region overlying the inferior lamina was localized under fluoroscopic visualization and the soft tissues overlying this structure were infiltrated with 4 ml. of 1% Lidocaine without Epinephrine. A # 20 gauge, Tuohy needle was inserted into the epidural space using a paramedian approach.  The epidural space was localized using loss of resistance along with lateral and contralateral oblique bi-planar fluoroscopic views.  After negative aspirate for air, blood, and CSF, a 2 ml. volume of Isovue-250 was injected into the epidural space and the flow of contrast was observed. Radiographs were obtained for documentation purposes.   The injectate was administered into the level noted above.  Additional Comments:  The patient tolerated the procedure well Dressing: 2 x 2 sterile gauze and Band-Aid    Post-procedure details: Patient was observed during the procedure. Post-procedure instructions were reviewed.  Patient left the clinic in stable condition.    Clinical History: MRI CERVICAL SPINE WITHOUT CONTRAST  TECHNIQUE: Multiplanar, multisequence MR imaging of the cervical spine was performed. No intravenous contrast was administered.  COMPARISON:  Cervical radiographs 01/15/2019  FINDINGS: Alignment: Normal  Vertebrae: Normal bone marrow.  Negative for fracture or mass  Cord: No cord signal abnormality identified. Cord evaluation limited by motion  Posterior Fossa, vertebral arteries, paraspinal tissues: Negative  Disc levels:  C2-3: Negative  C3-4: Negative  C4-5: Left foraminal encroachment due to spurring with expected impingement left C5 nerve root.  Spinal canal and right foramen patent  C5-6: Moderate disc degeneration with diffuse uncinate spurring. Moderate right foraminal encroachment and severe left foraminal  encroachment. Cord flattening with moderate spinal stenosis.  C6-7: Right paracentral disc protrusion with mild right foraminal encroachment. Cord flattening and mild spinal stenosis on the right  C7-T1: Bilateral facet degeneration contributing to mild foraminal narrowing bilaterally.  IMPRESSION: Left foraminal encroachment C4-5  Moderate right foraminal encroachment and severe left foraminal encroachment C5-6 due to spurring. Moderate spinal stenosis C5-6  Right sided disc protrusion at C6-7 with mild right foraminal encroachment  Mild foraminal narrowing bilaterally C7-T1.   Electronically Signed   By: Franchot Gallo M.D.   On: 02/19/2019 10:43     Objective:  VS:  HT:    WT:   BMI:     BP:(!) 156/103  HR:62bpm  TEMP: ( )  RESP:97 % Physical Exam  Ortho Exam Imaging: No results found.

## 2019-06-01 ENCOUNTER — Encounter: Payer: Self-pay | Admitting: Specialist

## 2019-06-01 ENCOUNTER — Ambulatory Visit (INDEPENDENT_AMBULATORY_CARE_PROVIDER_SITE_OTHER): Payer: 59 | Admitting: Specialist

## 2019-06-01 ENCOUNTER — Other Ambulatory Visit: Payer: Self-pay

## 2019-06-01 VITALS — BP 152/97 | HR 82 | Ht 71.0 in | Wt 278.0 lb

## 2019-06-01 DIAGNOSIS — M5030 Other cervical disc degeneration, unspecified cervical region: Secondary | ICD-10-CM

## 2019-06-01 DIAGNOSIS — M5010 Cervical disc disorder with radiculopathy, unspecified cervical region: Secondary | ICD-10-CM | POA: Diagnosis not present

## 2019-06-01 NOTE — Progress Notes (Signed)
Office Visit Note   Patient: Adam Hernandez           Date of Birth: Apr 16, 1966           MRN: [id number] Visit Date: 06/01/2019              Requested by: Juline Patch, MD [address] [place],  [place] [address] PCP: Juline Patch, MD   Assessment & Plan: Visit Diagnoses:  1. Herniation of cervical intervertebral disc with radiculopathy   2. Other cervical disc degeneration, unspecified cervical region     Plan: Avoid overhead lifting and overhead use of the arms. Do not lift greater than 5 lbs. Adjust head rest in vehicle to prevent hyperextension if rear ended. Take extra precautions to avoid falling, including use of a cane if you feel weak. Scheduling secretary Kandice Hams. will call you to arrange for surgery for your cervical spine. If you wish a second opinion please let us know and we can arrange for you. If you have worsening arm or leg numbness or weakness please call or go to an ER. We will contact your cardiologist and primary care physicians to seek clearance for your surgery. Surgery will be an anterior cervical discectomy and fusion at the C5-6 level with decompression of the cervical spinal canal at C5-6 and removal of bone pressing on the spinal cord with bone grafting and plate and screws, Local bone graft as well and allograft bone graft and vivigen.  Risks of surgery include risks of infection, bleeding and risks to the spinal cord and  Risks of sore throat and difficulty swallowing which should  Improve over the next 4-6 weeks following surgery. Surgery is indicated due to upper extremity radiculopathy, lermittes phenomena and falls. In the future surgery at adjacent levels may be necessary but these levels do not appear to be related to your current symptoms or signs.Use a soft collar for a period of 6 weeks.  Follow-Up Instructions: No follow-ups on file.   Orders:  No orders of the defined types were placed in this encounter.  No  orders of the defined types were placed in this encounter.     Procedures: No procedures performed   Clinical Data: No additional findings.   Subjective: Chief Complaint  Patient presents with  . Neck - Follow-up    53 year old right handed male with neck pain with radiation into the right arm. He has had several injections per Dr. Ernestina Patches with relief that lasts only 2.5 to 3.0 weeks. He is to undergo removal of a ankle plate by his orthopaedic surgeon but he recommends that the neck be fixed first. Left C7-T1 ESI last done 6/24 and again help only briefly  Post injection he had improvement in the left thumb numbness.   Review of Systems  Constitutional: Positive for activity change and unexpected weight change. Negative for appetite change, chills, diaphoresis, fatigue and fever.  HENT: Negative for congestion, dental problem, drooling, ear discharge, ear pain, facial swelling, hearing loss, mouth sores, nosebleeds, postnasal drip, rhinorrhea, sinus pressure, sinus pain, sneezing, sore throat, tinnitus, trouble swallowing and voice change.   Eyes: Negative for photophobia, pain, discharge, redness, itching and visual disturbance.  Respiratory: Positive for apnea. Negative for cough, choking, chest tightness, shortness of breath, wheezing and stridor.   Cardiovascular: Negative.   Gastrointestinal: Negative.  Negative for abdominal distention, abdominal pain, anal bleeding, blood in stool, constipation, diarrhea, nausea, rectal pain and vomiting.  Endocrine: Negative  for cold intolerance, heat intolerance, polydipsia, polyphagia and polyuria.  Genitourinary: Negative for difficulty urinating, dysuria, enuresis, flank pain, frequency, genital sores and urgency.  Musculoskeletal: Positive for neck pain and neck stiffness. Negative for arthralgias, back pain, gait problem, joint swelling and myalgias.  Skin: Negative for color change, pallor, rash and wound.  Neurological: Positive for  weakness and numbness. Negative for dizziness, tremors, seizures, syncope, facial asymmetry, speech difficulty, light-headedness and headaches.  Hematological: Negative.   Psychiatric/Behavioral: Negative.  Negative for agitation, behavioral problems, confusion, decreased concentration, dysphoric mood, hallucinations, self-injury, sleep disturbance and suicidal ideas. The patient is not nervous/anxious and is not hyperactive.      Objective: Vital Signs: BP (!) 152/97 (BP Location: Left Arm, Patient Position: Sitting)   Pulse 82   Ht 5\' 11"  (1.803 m)   Wt 278 lb (126.1 kg)   BMI 38.77 kg/m   Physical Exam  Ortho Exam  Specialty Comments:  No specialty comments available.  Imaging: No results found.   PMFS History: Patient Active Problem List   Diagnosis Date Noted  . Encounter for screening colonoscopy   . Polyp of sigmoid colon   . Synovitis and tenosynovitis 01/06/2019  . Strain of peroneal tendon 12/23/2018  . Closed dislocation of ankle 04/29/2018  . Elevated blood pressure reading 09/02/2017  . Neuropathy of right ankle 09/02/2017  . Obesity (BMI 35.0-39.9 without comorbidity) 09/02/2017  . Injury of nerve of lower extremity 09/02/2017  . Closed bimalleolar fracture 09/26/2016  . Loose body in ankle and foot joint 09/26/2016  . Sprain of deltoid ligament of ankle 09/26/2016   Past Medical History:  Diagnosis Date  . Anxiety   . Arm numbness left    cervical disc issues  . Arthritis    right leg  . GERD (gastroesophageal reflux disease)   . Motion sickness    ocean boats  . Obesity (BMI 35.0-39.9 without comorbidity) 09/02/2017    Family History  Adopted: Yes    Past Surgical History:  Procedure Laterality Date  . COLONOSCOPY WITH PROPOFOL N/A 04/30/2019   Procedure: COLONOSCOPY WITH BIOPSY;  Surgeon: Lucilla Lame, MD;  Location: [place];  Service: Endoscopy;  Laterality: N/A;  . LEG SURGERY Right 2017, 2018   fractured leg and ankle- has  screws and pins  . POLYPECTOMY N/A 04/30/2019   Procedure: POLYPECTOMY;  Surgeon: Lucilla Lame, MD;  Location: [place];  Service: Endoscopy;  Laterality: N/A;  . SHOULDER SURGERY Left 2013   labrial tear   Social History   Occupational History  . Not on file  Tobacco Use  . Smoking status: Never Smoker  . Smokeless tobacco: Never Used  Substance and Sexual Activity  . Alcohol use: Yes    Alcohol/week: 1.0 standard drinks    Types: 1 Cans of beer per week    Comment:    . Drug use: Never  . Sexual activity: Yes

## 2019-06-01 NOTE — Patient Instructions (Signed)
Avoid overhead lifting and overhead use of the arms. Do not lift greater than 5 lbs. Adjust head rest in vehicle to prevent hyperextension if rear ended. Take extra precautions to avoid falling, including use of a cane if you feel weak. Scheduling secretary Kandice Hams. will call you to arrange for surgery for your cervical spine. If you wish a second opinion please let us know and we can arrange for you. If you have worsening arm or leg numbness or weakness please call or go to an ER. We will contact your cardiologist and primary care physicians to seek clearance for your surgery. Surgery will be an anterior cervical discectomy and fusion at the C5-6 level with decompression of the cervical spinal canal at C5-6 and removal of bone pressing on the spinal cord with bone grafting and plate and screws, Local bone graft as well and allograft bone graft and vivigen.  Risks of surgery include risks of infection, bleeding and risks to the spinal cord and  Risks of sore throat and difficulty swallowing which should  Improve over the next 4-6 weeks following surgery. Surgery is indicated due to upper extremity radiculopathy, lermittes phenomena and falls. In the future surgery at adjacent levels may be necessary but these levels do not appear to be related to your current symptoms or signs.Use a soft collar for a period of 6 weeks.

## 2019-06-17 NOTE — Pre-Procedure Instructions (Addendum)
[place], [place] [address] Phone: [phone] Fax: [phone]  [id number] [address], [place] [address] Phone: [phone] Fax: [phone]      Your procedure is scheduled on Monday, August 10th.  Report to [facility] Main Entrance "A" at 5:30 A.M., and check in at the Admitting office.  Call this number if you have problems the morning of surgery:  [phone]  Call [phone] if you have any questions prior to your surgery date Monday-Friday 8am-4pm    Remember:  Do not eat after midnight the night before your surgery  You may drink clear liquids until 4:30 A.M. the morning of your surgery.   Clear liquids allowed are: Water, Non-Citrus Juices (without pulp), Carbonated Beverages, Clear Tea, Black Coffee Only, and Gatorade.  Please complete your PRE-SURGERY ENSURE that was provided to you by 4:30 A.M. the morning of surgery.  Please, if able, drink it in one setting. DO NOT SIP.     Take these medicines the morning of surgery with A SIP OF WATER  gabapentin (NEURONTIN)  7 days prior to surgery STOP taking any Aspirin (unless otherwise instructed by your surgeon), Aleve, Naproxen, Ibuprofen, Motrin, Advil, Goody's, BC's, all herbal medications, fish oil, and all vitamins. This includes: celecoxib (CELEBREX) and diclofenac sodium (VOLTAREN).     The Morning of Surgery  Do not wear jewelry.  Do not wear lotions, powders, or colognes, or deodorant  Do not shave 48 hours prior to surgery.  Men may shave face and neck.  Do not bring valuables to the hospital.  [facility] is not responsible for any belongings or valuables.  If you are a smoker, DO NOT Smoke 24 hours prior to surgery IF you wear a CPAP at night please bring your mask, tubing, and machine the morning of surgery   Remember that  you must have someone to transport you home after your surgery, and remain with you for 24 hours if you are discharged the same day.   Contacts, glasses, hearing aids, dentures or bridgework may not be worn into surgery.    Leave your suitcase in the car.  After surgery it may be brought to your room.  For patients admitted to the hospital, discharge time will be determined by your treatment team.  Patients discharged the day of surgery will not be allowed to drive home.    Special instructions:   [facility]- Preparing For Surgery  Before surgery, you can play an important role. Because skin is not sterile, your skin needs to be as free of germs as possible. You can reduce the number of germs on your skin by washing with CHG (chlorahexidine gluconate) Soap before surgery.  CHG is an antiseptic cleaner which kills germs and bonds with the skin to continue killing germs even after washing.    Oral Hygiene is also important to reduce your risk of infection.  Remember - BRUSH YOUR TEETH THE MORNING OF SURGERY WITH YOUR REGULAR TOOTHPASTE  Please do not use if you have an allergy to CHG or antibacterial soaps. If your skin becomes reddened/irritated stop using the CHG.  Do not shave (including legs and underarms) for at least 48 hours prior to first CHG shower. It is OK to shave your face.  Please follow these instructions carefully.   1. Shower the [employer] BEFORE SURGERY  and the MORNING OF SURGERY with CHG Soap.   2. If you chose to wash your hair, wash your hair first as usual with your normal shampoo.  3. After you shampoo, rinse your hair and body thoroughly to remove the shampoo.  4. Use CHG as you would any other liquid soap. You can apply CHG directly to the skin and wash gently with a scrungie or a clean washcloth.   5. Apply the CHG Soap to your body ONLY FROM THE NECK DOWN.  Do not use on open wounds or open sores. Avoid contact with your eyes, ears, mouth and genitals (private  parts). Wash Face and genitals (private parts)  with your normal soap.   6. Wash thoroughly, paying special attention to the area where your surgery will be performed.  7. Thoroughly rinse your body with warm water from the neck down.  8. DO NOT shower/wash with your normal soap after using and rinsing off the CHG Soap.  9. Pat yourself dry with a CLEAN TOWEL.  10. Wear CLEAN PAJAMAS to bed the night before surgery, wear comfortable clothes the morning of surgery  11. Place CLEAN SHEETS on your bed the night of your first shower and DO NOT SLEEP WITH PETS.    Day of Surgery:  Do not apply any deodorants/lotions. Please shower the morning of surgery with the CHG soap  Please wear clean clothes to the hospital/surgery center.   Remember to brush your teeth WITH YOUR REGULAR TOOTHPASTE.   Please read over the following fact sheets that you were given.

## 2019-06-18 ENCOUNTER — Other Ambulatory Visit
Admission: RE | Admit: 2019-06-18 | Discharge: 2019-06-18 | Disposition: A | Discharge status: Home / Self Care | Payer: 59 | Source: Ambulatory Visit | Attending: Specialist | Admitting: Specialist

## 2019-06-18 ENCOUNTER — Encounter (HOSPITAL_COMMUNITY): Payer: Self-pay

## 2019-06-18 ENCOUNTER — Encounter: Payer: Self-pay | Admitting: Surgery

## 2019-06-18 ENCOUNTER — Ambulatory Visit (INDEPENDENT_AMBULATORY_CARE_PROVIDER_SITE_OTHER): Payer: 59 | Admitting: Surgery

## 2019-06-18 ENCOUNTER — Other Ambulatory Visit: Payer: Self-pay

## 2019-06-18 ENCOUNTER — Encounter (HOSPITAL_COMMUNITY)
Admission: RE | Admit: 2019-06-18 | Discharge: 2019-06-18 | Disposition: A | Discharge status: Home / Self Care | Payer: 59 | Source: Ambulatory Visit | Attending: Specialist | Admitting: Specialist

## 2019-06-18 VITALS — BP 138/85 | HR 76 | Ht 72.0 in | Wt 270.0 lb

## 2019-06-18 DIAGNOSIS — M50223 Other cervical disc displacement at C6-C7 level: Secondary | ICD-10-CM | POA: Insufficient documentation

## 2019-06-18 DIAGNOSIS — M50221 Other cervical disc displacement at C4-C5 level: Secondary | ICD-10-CM | POA: Diagnosis not present

## 2019-06-18 DIAGNOSIS — Z01812 Encounter for preprocedural laboratory examination: Secondary | ICD-10-CM | POA: Insufficient documentation

## 2019-06-18 DIAGNOSIS — Z20828 Contact with and (suspected) exposure to other viral communicable diseases: Secondary | ICD-10-CM | POA: Insufficient documentation

## 2019-06-18 DIAGNOSIS — M5412 Radiculopathy, cervical region: Secondary | ICD-10-CM

## 2019-06-18 HISTORY — DX: Myoneural disorder, unspecified: G70.9

## 2019-06-18 HISTORY — DX: Polyneuropathy, unspecified: G62.9

## 2019-06-18 LAB — CBC WITH DIFFERENTIAL/PLATELET
Abs Immature Granulocytes: 0.03 10*3/uL (ref 0.00–0.07)
Basophils Absolute: 0.1 10*3/uL (ref 0.0–0.1)
Basophils Relative: 1 %
Eosinophils Absolute: 0.4 10*3/uL (ref 0.0–0.5)
Eosinophils Relative: 4 %
HCT: 47.8 % (ref 39.0–52.0)
Hemoglobin: 15.3 g/dL (ref 13.0–17.0)
Immature Granulocytes: 0 %
Lymphocytes Relative: 27 %
Lymphs Abs: 2.4 10*3/uL (ref 0.7–4.0)
MCH: 29.5 pg (ref 26.0–34.0)
MCHC: 32 g/dL (ref 30.0–36.0)
MCV: 92.1 fL (ref 80.0–100.0)
Monocytes Absolute: 0.7 10*3/uL (ref 0.1–1.0)
Monocytes Relative: 8 %
Neutro Abs: 5.2 10*3/uL (ref 1.7–7.7)
Neutrophils Relative %: 60 %
Platelets: 306 10*3/uL (ref 150–400)
RBC: 5.19 MIL/uL (ref 4.22–5.81)
RDW: 13.1 % (ref 11.5–15.5)
WBC: 8.8 10*3/uL (ref 4.0–10.5)
nRBC: 0 % (ref 0.0–0.2)

## 2019-06-18 LAB — SURGICAL PCR SCREEN
MRSA, PCR: NEGATIVE
Staphylococcus aureus: POSITIVE — AB

## 2019-06-18 LAB — SARS CORONAVIRUS 2 (TAT 6-24 HRS): SARS Coronavirus 2: NEGATIVE

## 2019-06-18 LAB — COMPREHENSIVE METABOLIC PANEL WITH GFR
ALT: 26 U/L (ref 0–44)
AST: 19 U/L (ref 15–41)
Albumin: 3.9 g/dL (ref 3.5–5.0)
Alkaline Phosphatase: 77 U/L (ref 38–126)
Anion gap: 12 (ref 5–15)
BUN: 24 mg/dL — ABNORMAL HIGH (ref 6–20)
CO2: 20 mmol/L — ABNORMAL LOW (ref 22–32)
Calcium: 9 mg/dL (ref 8.9–10.3)
Chloride: 104 mmol/L (ref 98–111)
Creatinine, Ser: 1.53 mg/dL — ABNORMAL HIGH (ref 0.61–1.24)
GFR calc Af Amer: 60 mL/min — ABNORMAL LOW
GFR calc non Af Amer: 52 mL/min — ABNORMAL LOW
Glucose, Bld: 110 mg/dL — ABNORMAL HIGH (ref 70–99)
Potassium: 4.2 mmol/L (ref 3.5–5.1)
Sodium: 136 mmol/L (ref 135–145)
Total Bilirubin: 0.6 mg/dL (ref 0.3–1.2)
Total Protein: 7.5 g/dL (ref 6.5–8.1)

## 2019-06-18 LAB — URINALYSIS, ROUTINE W REFLEX MICROSCOPIC
Bacteria, UA: NONE SEEN
Bilirubin Urine: NEGATIVE
Glucose, UA: NEGATIVE mg/dL
Ketones, ur: NEGATIVE mg/dL
Leukocytes,Ua: NEGATIVE
Nitrite: NEGATIVE
Protein, ur: 30 mg/dL — AB
Specific Gravity, Urine: 1.026 (ref 1.005–1.030)
pH: 5 (ref 5.0–8.0)

## 2019-06-18 LAB — PROTIME-INR
INR: 1 (ref 0.8–1.2)
Prothrombin Time: 12.6 s (ref 11.4–15.2)

## 2019-06-18 LAB — APTT: aPTT: 27 s (ref 24–36)

## 2019-06-18 NOTE — Progress Notes (Signed)
[facility] VM left with patient regarding prescription.   Jacqlyn Larsen, RN

## 2019-06-18 NOTE — Progress Notes (Signed)
53 year old white male with history of C5-6 HNP/stenosis, neck pain and left greater than right upper extremity radiculopathy comes in for preop evaluation.  He continues to have pain radiating down to the left hand along with discomfort radiating down to the right elbow.  He is wanting to proceed with C5-6 ACDF as scheduled.  Today history and physical performed.  Surgical procedure along with potential rehab/recovery time discussed.  All questions answered.  Patient states that he has been doing rehab for a chronic right ankle and foot issue and I advised him to hold off on doing therapy there until he has recovered from his neck.

## 2019-06-18 NOTE — Progress Notes (Signed)
   06/18/19 1017  OBSTRUCTIVE SLEEP APNEA  Have you ever been diagnosed with sleep apnea through a sleep study? No  Do you snore loudly (loud enough to be heard through closed doors)?  1  Do you often feel tired, fatigued, or sleepy during the daytime (such as falling asleep during driving or talking to someone)? 0  Has anyone observed you stop breathing during your sleep? 0  Do you have, or are you being treated for high blood pressure? 0  BMI more than 35 kg/m2? 1  Age > 50 (1-yes) 1  Neck circumference greater than:Male 16 inches or larger, Male 17inches or larger? 1  Male Gender (Yes=1) 1  Obstructive Sleep Apnea Score 5  Score 5 or greater  Results sent to PCP

## 2019-06-18 NOTE — Progress Notes (Signed)
PCP - Dr. Otilio Miu Cardiologist - denies  Chest x-ray - N/A EKG - N/A Stress Test -denies  ECHO - denies Cardiac Cath - denies  Sleep Study - denies; stop bang positive. Sent to PCP CPAP - denies  Blood Thinner Instructions:N/A Aspirin Instructions:N/A  Anesthesia review: No  Patient denies shortness of breath, fever, cough and chest pain at PAT appointment   Patient verbalized understanding of instructions that were given to them at the PAT appointment. Patient was also instructed that they will need to review over the PAT instructions again at home before surgery.  Pt scheduled to have covid test done today at [facility]. Pt aware of self quarantine after testing.    Coronavirus Screening  Have you experienced the following symptoms:  Cough yes/no: No Fever (>100.74F)  yes/no: No Runny nose yes/no: No Sore throat yes/no: No Difficulty breathing/shortness of breath  yes/no: No  Have you or a family member traveled in the last 14 days and where? yes/no: No   If the patient indicates "YES" to the above questions, their PAT will be rescheduled to limit the exposure to others and, the surgeon will be notified. THE PATIENT WILL NEED TO BE ASYMPTOMATIC FOR 14 DAYS.   If the patient is not experiencing any of these symptoms, the PAT nurse will instruct them to NOT bring anyone with them to their appointment since they may have these symptoms or traveled as well.   Please remind your patients and families that hospital visitation restrictions are in effect and the importance of the restrictions.

## 2019-06-19 ENCOUNTER — Other Ambulatory Visit: Payer: Self-pay

## 2019-06-19 MED ORDER — DEXTROSE 5 % IV SOLN
3.0000 g | INTRAVENOUS | Status: AC
  Administered 2019-06-22: 08:00:00 3 g via INTRAVENOUS
  Filled 2019-06-19: qty 3

## 2019-06-22 ENCOUNTER — Ambulatory Visit (HOSPITAL_COMMUNITY): Payer: 59

## 2019-06-22 ENCOUNTER — Ambulatory Visit (HOSPITAL_COMMUNITY): Payer: 59 | Admitting: Anesthesiology

## 2019-06-22 ENCOUNTER — Other Ambulatory Visit: Payer: Self-pay

## 2019-06-22 ENCOUNTER — Encounter (HOSPITAL_COMMUNITY): Payer: Self-pay

## 2019-06-22 ENCOUNTER — Observation Stay (HOSPITAL_COMMUNITY)
Admission: RE | Admit: 2019-06-22 | Discharge: 2019-06-23 | Disposition: A | Discharge status: Home / Self Care | Payer: 59 | Attending: Specialist | Admitting: Specialist

## 2019-06-22 ENCOUNTER — Ambulatory Visit (HOSPITAL_COMMUNITY)
Admission: RE | Disposition: A | Discharge status: Home / Self Care | Payer: Self-pay | Source: Home / Self Care | Attending: Specialist

## 2019-06-22 DIAGNOSIS — R2681 Unsteadiness on feet: Secondary | ICD-10-CM | POA: Diagnosis not present

## 2019-06-22 DIAGNOSIS — D125 Benign neoplasm of sigmoid colon: Secondary | ICD-10-CM | POA: Diagnosis not present

## 2019-06-22 DIAGNOSIS — M4802 Spinal stenosis, cervical region: Secondary | ICD-10-CM | POA: Diagnosis not present

## 2019-06-22 DIAGNOSIS — M47812 Spondylosis without myelopathy or radiculopathy, cervical region: Secondary | ICD-10-CM | POA: Diagnosis not present

## 2019-06-22 DIAGNOSIS — M1990 Unspecified osteoarthritis, unspecified site: Secondary | ICD-10-CM | POA: Insufficient documentation

## 2019-06-22 DIAGNOSIS — M5010 Cervical disc disorder with radiculopathy, unspecified cervical region: Secondary | ICD-10-CM

## 2019-06-22 DIAGNOSIS — Z981 Arthrodesis status: Secondary | ICD-10-CM

## 2019-06-22 DIAGNOSIS — K219 Gastro-esophageal reflux disease without esophagitis: Secondary | ICD-10-CM | POA: Diagnosis not present

## 2019-06-22 DIAGNOSIS — Z791 Long term (current) use of non-steroidal anti-inflammatories (NSAID): Secondary | ICD-10-CM | POA: Insufficient documentation

## 2019-06-22 DIAGNOSIS — G629 Polyneuropathy, unspecified: Secondary | ICD-10-CM | POA: Diagnosis not present

## 2019-06-22 DIAGNOSIS — M50121 Cervical disc disorder at C4-C5 level with radiculopathy: Secondary | ICD-10-CM | POA: Diagnosis not present

## 2019-06-22 DIAGNOSIS — R2689 Other abnormalities of gait and mobility: Secondary | ICD-10-CM | POA: Insufficient documentation

## 2019-06-22 DIAGNOSIS — Z6835 Body mass index (BMI) 35.0-35.9, adult: Secondary | ICD-10-CM | POA: Diagnosis not present

## 2019-06-22 DIAGNOSIS — M50321 Other cervical disc degeneration at C4-C5 level: Secondary | ICD-10-CM | POA: Diagnosis not present

## 2019-06-22 DIAGNOSIS — Z419 Encounter for procedure for purposes other than remedying health state, unspecified: Secondary | ICD-10-CM

## 2019-06-22 DIAGNOSIS — F419 Anxiety disorder, unspecified: Secondary | ICD-10-CM | POA: Diagnosis not present

## 2019-06-22 DIAGNOSIS — Z79899 Other long term (current) drug therapy: Secondary | ICD-10-CM | POA: Insufficient documentation

## 2019-06-22 DIAGNOSIS — E669 Obesity, unspecified: Secondary | ICD-10-CM | POA: Insufficient documentation

## 2019-06-22 DIAGNOSIS — M50221 Other cervical disc displacement at C4-C5 level: Secondary | ICD-10-CM | POA: Insufficient documentation

## 2019-06-22 HISTORY — PX: ANTERIOR CERVICAL DECOMP/DISCECTOMY FUSION: SHX1161

## 2019-06-22 SURGERY — ANTERIOR CERVICAL DECOMPRESSION/DISCECTOMY FUSION 1 LEVEL
Anesthesia: General | Site: Neck

## 2019-06-22 MED ORDER — FENTANYL CITRATE (PF) 250 MCG/5ML IJ SOLN
INTRAMUSCULAR | Status: AC
  Filled 2019-06-22: qty 5

## 2019-06-22 MED ORDER — HYDROMORPHONE HCL 1 MG/ML IJ SOLN
0.2500 mg | INTRAMUSCULAR | Status: DC | PRN
  Administered 2019-06-22 (×3): 0.5 mg via INTRAVENOUS

## 2019-06-22 MED ORDER — MIDAZOLAM HCL 2 MG/2ML IJ SOLN
INTRAMUSCULAR | Status: AC
  Filled 2019-06-22: qty 2

## 2019-06-22 MED ORDER — PHENYLEPHRINE 40 MCG/ML (10ML) SYRINGE FOR IV PUSH (FOR BLOOD PRESSURE SUPPORT)
PREFILLED_SYRINGE | INTRAVENOUS | Status: DC | PRN
  Administered 2019-06-22 (×2): 80 ug via INTRAVENOUS

## 2019-06-22 MED ORDER — SODIUM CHLORIDE 0.9 % IV SOLN: INTRAVENOUS | Status: DC

## 2019-06-22 MED ORDER — CHLORHEXIDINE GLUCONATE 4 % EX LIQD: 60.0000 mL | Freq: Once | CUTANEOUS | Status: DC

## 2019-06-22 MED ORDER — POLYETHYLENE GLYCOL 3350 17 G PO PACK: 17.0000 g | PACK | Freq: Every day | ORAL | Status: DC | PRN

## 2019-06-22 MED ORDER — GABAPENTIN 400 MG PO CAPS
400.0000 mg | ORAL_CAPSULE | Freq: Four times a day (QID) | ORAL | Status: DC
  Administered 2019-06-22 – 2019-06-23 (×5): 400 mg via ORAL
  Filled 2019-06-22 (×5): qty 1

## 2019-06-22 MED ORDER — THROMBIN (RECOMBINANT) 20000 UNITS EX SOLR
CUTANEOUS | Status: AC
  Filled 2019-06-22: qty 20000

## 2019-06-22 MED ORDER — PROPOFOL 10 MG/ML IV BOLUS
INTRAVENOUS | Status: AC
  Filled 2019-06-22: qty 40

## 2019-06-22 MED ORDER — SUGAMMADEX SODIUM 200 MG/2ML IV SOLN
INTRAVENOUS | Status: DC | PRN
  Administered 2019-06-22: 200 mg via INTRAVENOUS

## 2019-06-22 MED ORDER — METHOCARBAMOL 1000 MG/10ML IJ SOLN
500.0000 mg | Freq: Four times a day (QID) | INTRAVENOUS | Status: DC | PRN
  Filled 2019-06-22: qty 5

## 2019-06-22 MED ORDER — MORPHINE SULFATE (PF) 2 MG/ML IV SOLN: 2.0000 mg | INTRAVENOUS | Status: DC | PRN

## 2019-06-22 MED ORDER — ACETAMINOPHEN 650 MG RE SUPP: 650.0000 mg | RECTAL | Status: DC | PRN

## 2019-06-22 MED ORDER — BUPIVACAINE HCL (PF) 0.5 % IJ SOLN
INTRAMUSCULAR | Status: AC
  Filled 2019-06-22: qty 30

## 2019-06-22 MED ORDER — METHOCARBAMOL 500 MG PO TABS
500.0000 mg | ORAL_TABLET | Freq: Four times a day (QID) | ORAL | Status: DC | PRN
  Administered 2019-06-22 – 2019-06-23 (×2): 500 mg via ORAL
  Filled 2019-06-22: qty 1

## 2019-06-22 MED ORDER — HYDROMORPHONE HCL 1 MG/ML IJ SOLN
INTRAMUSCULAR | Status: AC
  Filled 2019-06-22: qty 1

## 2019-06-22 MED ORDER — MIDAZOLAM HCL 5 MG/5ML IJ SOLN
INTRAMUSCULAR | Status: DC | PRN
  Administered 2019-06-22: 2 mg via INTRAVENOUS

## 2019-06-22 MED ORDER — LIDOCAINE 2% (20 MG/ML) 5 ML SYRINGE
INTRAMUSCULAR | Status: AC
  Filled 2019-06-22: qty 5

## 2019-06-22 MED ORDER — LACTATED RINGERS IV SOLN
INTRAVENOUS | Status: DC | PRN
  Administered 2019-06-22 (×2): via INTRAVENOUS

## 2019-06-22 MED ORDER — PANTOPRAZOLE SODIUM 40 MG IV SOLR
40.0000 mg | Freq: Every day | INTRAVENOUS | Status: DC
  Administered 2019-06-22: 21:00:00 40 mg via INTRAVENOUS
  Filled 2019-06-22: qty 40

## 2019-06-22 MED ORDER — LIDOCAINE 2% (20 MG/ML) 5 ML SYRINGE
INTRAMUSCULAR | Status: DC | PRN
  Administered 2019-06-22: 100 mg via INTRAVENOUS

## 2019-06-22 MED ORDER — SODIUM CHLORIDE 0.9% FLUSH: 3.0000 mL | INTRAVENOUS | Status: DC | PRN

## 2019-06-22 MED ORDER — CEFAZOLIN SODIUM-DEXTROSE 2-4 GM/100ML-% IV SOLN
2.0000 g | Freq: Three times a day (TID) | INTRAVENOUS | Status: AC
  Administered 2019-06-22 (×2): 2 g via INTRAVENOUS
  Filled 2019-06-22 (×2): qty 100

## 2019-06-22 MED ORDER — PROPOFOL 10 MG/ML IV BOLUS
INTRAVENOUS | Status: DC | PRN
  Administered 2019-06-22: 150 mg via INTRAVENOUS

## 2019-06-22 MED ORDER — ONDANSETRON HCL 4 MG/2ML IJ SOLN: 4.0000 mg | Freq: Once | INTRAMUSCULAR | Status: DC | PRN

## 2019-06-22 MED ORDER — THROMBIN 20000 UNITS EX SOLR
CUTANEOUS | Status: DC | PRN
  Administered 2019-06-22: 20 mL

## 2019-06-22 MED ORDER — ROCURONIUM BROMIDE 50 MG/5ML IV SOSY
PREFILLED_SYRINGE | INTRAVENOUS | Status: DC | PRN
  Administered 2019-06-22: 120 mg via INTRAVENOUS

## 2019-06-22 MED ORDER — ALUM & MAG HYDROXIDE-SIMETH 200-200-20 MG/5ML PO SUSP: 30.0000 mL | Freq: Four times a day (QID) | ORAL | Status: DC | PRN

## 2019-06-22 MED ORDER — BISACODYL 5 MG PO TBEC: 5.0000 mg | DELAYED_RELEASE_TABLET | Freq: Every day | ORAL | Status: DC | PRN

## 2019-06-22 MED ORDER — FENTANYL CITRATE (PF) 100 MCG/2ML IJ SOLN
INTRAMUSCULAR | Status: DC | PRN
  Administered 2019-06-22: 50 ug via INTRAVENOUS
  Administered 2019-06-22: 100 ug via INTRAVENOUS
  Administered 2019-06-22 (×2): 50 ug via INTRAVENOUS

## 2019-06-22 MED ORDER — BUPIVACAINE HCL 0.5 % IJ SOLN
INTRAMUSCULAR | Status: DC | PRN
  Administered 2019-06-22: 7 mL

## 2019-06-22 MED ORDER — HYDROCODONE-ACETAMINOPHEN 7.5-325 MG PO TABS
1.0000 | ORAL_TABLET | Freq: Four times a day (QID) | ORAL | Status: DC
  Administered 2019-06-22 – 2019-06-23 (×4): 1 via ORAL
  Filled 2019-06-22 (×4): qty 1

## 2019-06-22 MED ORDER — ACETAMINOPHEN 325 MG PO TABS: 650.0000 mg | ORAL_TABLET | ORAL | Status: DC | PRN

## 2019-06-22 MED ORDER — BUPIVACAINE LIPOSOME 1.3 % IJ SUSP
INTRAMUSCULAR | Status: DC | PRN
  Administered 2019-06-22: 7 mL

## 2019-06-22 MED ORDER — DOCUSATE SODIUM 100 MG PO CAPS
100.0000 mg | ORAL_CAPSULE | Freq: Two times a day (BID) | ORAL | Status: DC
  Administered 2019-06-22 – 2019-06-23 (×3): 100 mg via ORAL
  Filled 2019-06-22 (×3): qty 1

## 2019-06-22 MED ORDER — PHENOL 1.4 % MT LIQD: 1.0000 | OROMUCOSAL | Status: DC | PRN

## 2019-06-22 MED ORDER — DEXAMETHASONE SODIUM PHOSPHATE 10 MG/ML IJ SOLN
INTRAMUSCULAR | Status: DC | PRN
  Administered 2019-06-22: 10 mg via INTRAVENOUS

## 2019-06-22 MED ORDER — 0.9 % SODIUM CHLORIDE (POUR BTL) OPTIME
TOPICAL | Status: DC | PRN
  Administered 2019-06-22 (×2): 1000 mL

## 2019-06-22 MED ORDER — ONDANSETRON HCL 4 MG/2ML IJ SOLN: 4.0000 mg | Freq: Four times a day (QID) | INTRAMUSCULAR | Status: DC | PRN

## 2019-06-22 MED ORDER — GABAPENTIN 300 MG PO CAPS: 300.0000 mg | ORAL_CAPSULE | Freq: Three times a day (TID) | ORAL | Status: DC

## 2019-06-22 MED ORDER — SODIUM CHLORIDE 0.9% FLUSH
3.0000 mL | Freq: Two times a day (BID) | INTRAVENOUS | Status: DC
  Administered 2019-06-22: 21:00:00 3 mL via INTRAVENOUS

## 2019-06-22 MED ORDER — MENTHOL 3 MG MT LOZG: 1.0000 | LOZENGE | OROMUCOSAL | Status: DC | PRN

## 2019-06-22 MED ORDER — BUPIVACAINE LIPOSOME 1.3 % IJ SUSP
20.0000 mL | INTRAMUSCULAR | Status: DC
  Filled 2019-06-22: qty 20

## 2019-06-22 MED ORDER — MEPERIDINE HCL 25 MG/ML IJ SOLN: 6.2500 mg | INTRAMUSCULAR | Status: DC | PRN

## 2019-06-22 MED ORDER — ONDANSETRON HCL 4 MG PO TABS: 4.0000 mg | ORAL_TABLET | Freq: Four times a day (QID) | ORAL | Status: DC | PRN

## 2019-06-22 MED ORDER — FLEET ENEMA 7-19 GM/118ML RE ENEM: 1.0000 | ENEMA | Freq: Once | RECTAL | Status: DC | PRN

## 2019-06-22 MED ORDER — ONDANSETRON HCL 4 MG/2ML IJ SOLN
INTRAMUSCULAR | Status: AC
  Filled 2019-06-22: qty 2

## 2019-06-22 MED ORDER — ONDANSETRON HCL 4 MG/2ML IJ SOLN
INTRAMUSCULAR | Status: DC | PRN
  Administered 2019-06-22: 4 mg via INTRAVENOUS

## 2019-06-22 MED ORDER — METHOCARBAMOL 500 MG PO TABS
ORAL_TABLET | ORAL | Status: AC
  Filled 2019-06-22: qty 1

## 2019-06-22 MED ORDER — ROCURONIUM BROMIDE 10 MG/ML (PF) SYRINGE
PREFILLED_SYRINGE | INTRAVENOUS | Status: AC
  Filled 2019-06-22: qty 20

## 2019-06-22 MED ORDER — SODIUM CHLORIDE 0.9 % IV SOLN
INTRAVENOUS | Status: DC | PRN
  Administered 2019-06-22: 09:00:00 20 ug/min via INTRAVENOUS

## 2019-06-22 MED ORDER — HYDROCODONE-ACETAMINOPHEN 10-325 MG PO TABS: 1.0000 | ORAL_TABLET | ORAL | Status: DC | PRN

## 2019-06-22 MED ORDER — ACETAMINOPHEN 500 MG PO TABS
1000.0000 mg | ORAL_TABLET | Freq: Four times a day (QID) | ORAL | Status: DC
  Administered 2019-06-22 (×3): 1000 mg via ORAL
  Filled 2019-06-22 (×4): qty 2

## 2019-06-22 MED ORDER — OXYCODONE HCL 5 MG PO TABS
10.0000 mg | ORAL_TABLET | ORAL | Status: DC | PRN
  Administered 2019-06-22 – 2019-06-23 (×2): 10 mg via ORAL
  Filled 2019-06-22 (×3): qty 2

## 2019-06-22 MED ORDER — PHENYLEPHRINE 40 MCG/ML (10ML) SYRINGE FOR IV PUSH (FOR BLOOD PRESSURE SUPPORT)
PREFILLED_SYRINGE | INTRAVENOUS | Status: AC
  Filled 2019-06-22: qty 10

## 2019-06-22 MED ORDER — DEXAMETHASONE SODIUM PHOSPHATE 10 MG/ML IJ SOLN
INTRAMUSCULAR | Status: AC
  Filled 2019-06-22: qty 1

## 2019-06-22 SURGICAL SUPPLY — 65 items
BENZOIN TINCTURE PRP APPL 2/3 (GAUZE/BANDAGES/DRESSINGS) ×3 IMPLANT
BIT DRILL SRG 14X2.2XFLT CHK (BIT) ×1 IMPLANT
BIT DRL SRG 14X2.2XFLT CHK (BIT) ×1
BLADE CLIPPER SURG (BLADE) IMPLANT
BONE VIVIGEN FORMABLE 1.3CC (Bone Implant) ×3 IMPLANT
BUR MATCHSTICK NEURO 3.0 LAGG (BURR) ×3 IMPLANT
BUR RND FLUTED 2.5 (BURR) ×3 IMPLANT
CANISTER SUCT 3000ML PPV (MISCELLANEOUS) ×3 IMPLANT
CLOSURE STERI-STRIP 1/4X4 (GAUZE/BANDAGES/DRESSINGS) ×3 IMPLANT
CLOSURE WOUND 1/2 X4 (GAUZE/BANDAGES/DRESSINGS) ×1
COLLAR CERV LO CONTOUR FIRM DE (SOFTGOODS) IMPLANT
COVER SURGICAL LIGHT HANDLE (MISCELLANEOUS) ×3 IMPLANT
COVER WAND RF STERILE (DRAPES) ×3 IMPLANT
DECANTER SPIKE VIAL GLASS SM (MISCELLANEOUS) ×3 IMPLANT
DERMABOND ADVANCED (GAUZE/BANDAGES/DRESSINGS) ×2
DERMABOND ADVANCED .7 DNX12 (GAUZE/BANDAGES/DRESSINGS) ×1 IMPLANT
DRAIN TLS ROUND 10FR (DRAIN) IMPLANT
DRAPE C-ARM 42X72 X-RAY (DRAPES) IMPLANT
DRAPE MICROSCOPE LEICA (MISCELLANEOUS) ×3 IMPLANT
DRAPE POUCH INSTRU U-SHP 10X18 (DRAPES) ×3 IMPLANT
DRAPE SURG 17X23 STRL (DRAPES) ×9 IMPLANT
DRILL BIT SKYLINE 14MM (BIT) ×2
DRSG MEPILEX BORDER 4X4 (GAUZE/BANDAGES/DRESSINGS) ×3 IMPLANT
DURAPREP 6ML APPLICATOR 50/CS (WOUND CARE) ×3 IMPLANT
ELECT BLADE 4.0 EZ CLEAN MEGAD (MISCELLANEOUS)
ELECT COATED BLADE 2.86 ST (ELECTRODE) ×3 IMPLANT
ELECT REM PT RETURN 9FT ADLT (ELECTROSURGICAL) ×3
ELECTRODE BLDE 4.0 EZ CLN MEGD (MISCELLANEOUS) IMPLANT
ELECTRODE REM PT RTRN 9FT ADLT (ELECTROSURGICAL) ×1 IMPLANT
GLOVE BIOGEL PI IND STRL 8 (GLOVE) ×1 IMPLANT
GLOVE BIOGEL PI INDICATOR 8 (GLOVE) ×2
GLOVE ECLIPSE 9.0 STRL (GLOVE) ×3 IMPLANT
GLOVE ORTHO TXT STRL SZ7.5 (GLOVE) ×3 IMPLANT
GLOVE SURG 8.5 LATEX PF (GLOVE) ×3 IMPLANT
GOWN STRL REUS W/ TWL LRG LVL3 (GOWN DISPOSABLE) ×2 IMPLANT
GOWN STRL REUS W/TWL 2XL LVL3 (GOWN DISPOSABLE) ×6 IMPLANT
GOWN STRL REUS W/TWL LRG LVL3 (GOWN DISPOSABLE) ×4
HALTER HD/CHIN CERV TRACTION D (MISCELLANEOUS) ×3 IMPLANT
KIT BASIN OR (CUSTOM PROCEDURE TRAY) ×3 IMPLANT
KIT TURNOVER KIT B (KITS) ×3 IMPLANT
NEEDLE SPNL 20GX3.5 QUINCKE YW (NEEDLE) ×6 IMPLANT
NS IRRIG 1000ML POUR BTL (IV SOLUTION) ×3 IMPLANT
PACK ORTHO CERVICAL (CUSTOM PROCEDURE TRAY) ×3 IMPLANT
PAD ARMBOARD 7.5X6 YLW CONV (MISCELLANEOUS) ×6 IMPLANT
PATTIES SURGICAL .5 X.5 (GAUZE/BANDAGES/DRESSINGS) IMPLANT
PIN DISTRACTION 14 (PIN) ×6 IMPLANT
PIN DISTRACTION 14MM (PIN) ×6 IMPLANT
PLATE ONE LEVEL SKYLINE 14MM (Plate) ×3 IMPLANT
SCREW SKYLINE 14MM SD-VA (Screw) ×12 IMPLANT
SPACER ADV ACF 9MM (Bone Implant) ×3 IMPLANT
SPONGE INTESTINAL PEANUT (DISPOSABLE) IMPLANT
SPONGE LAP 4X18 RFD (DISPOSABLE) IMPLANT
SPONGE SURGIFOAM ABS GEL 100 (HEMOSTASIS) ×3 IMPLANT
STRIP CLOSURE SKIN 1/2X4 (GAUZE/BANDAGES/DRESSINGS) ×2 IMPLANT
SUT ETHILON 4 0 PS 2 18 (SUTURE) ×3 IMPLANT
SUT VIC AB 2-0 CT1 27 (SUTURE)
SUT VIC AB 2-0 CT1 TAPERPNT 27 (SUTURE) IMPLANT
SUT VIC AB 2-0 CTB1 (SUTURE) ×3 IMPLANT
SUT VIC AB 3-0 X1 27 (SUTURE) ×3 IMPLANT
SUT VICRYL 4-0 PS2 18IN ABS (SUTURE) ×3 IMPLANT
SYR 20ML LL LF (SYRINGE) ×3 IMPLANT
SYSTEM CHEST DRAIN TLS 7FR (DRAIN) IMPLANT
TOWEL GREEN STERILE (TOWEL DISPOSABLE) ×3 IMPLANT
TOWEL GREEN STERILE FF (TOWEL DISPOSABLE) ×3 IMPLANT
WATER STERILE IRR 1000ML POUR (IV SOLUTION) ×3 IMPLANT

## 2019-06-22 NOTE — Brief Op Note (Signed)
06/22/2019  10:10 AM  PATIENT:  Adam Hernandez  53 y.o. male  PRE-OPERATIVE DIAGNOSIS:  C5-6 herniated disc, Degenerative Disc Disease C4-5, C6-7  POST-OPERATIVE DIAGNOSIS:  C5-6 herniated disc, Degenerative Disc Disease C4-5, C6-7  PROCEDURE:  Procedure(s): ANTERIOR CERVICAL DISCECTOMY FUSION C5-6 with plates, screws, allograft bone graft, local bone graft and Vivigen (N/A)  SURGEON:  Surgeon(s) and Role:    * Jessy Oto, MD - Primary  PHYSICIAN ASSISTANT: Esaw Grandchild  ANESTHESIA:   local and general, Dr. Conrad Ulm  EBL:  25 mL   BLOOD ADMINISTERED:none  DRAINS: none   LOCAL MEDICATIONS USED:  0.5% MARCAINE 1:1 EXPAREL 1.5% Amount: 10 ml  SPECIMEN:  No Specimen  DISPOSITION OF SPECIMEN:  N/A  COUNTS:  YES  TOURNIQUET:  * No tourniquets in log *  DICTATION: .Dragon Dictation  PLAN OF CARE: Admit for overnight observation  PATIENT DISPOSITION:  PACU - hemodynamically stable.   Delay start of Pharmacological VTE agent (>24hrs) due to surgical blood loss or risk of bleeding: yes

## 2019-06-22 NOTE — Op Note (Signed)
06/22/2019  10:13 AM  PATIENT:  Adam Hernandez  53 y.o. male  MRN: [id number]  OPERATIVE REPORT  PRE-OPERATIVE DIAGNOSIS:  C5-6 herniated disc, Degenerative Disc Disease C4-5, C6-7  POST-OPERATIVE DIAGNOSIS:  C5-6 herniated disc, Degenerative Disc Disease C4-5, C6-7  PROCEDURE:  Procedure(s): ANTERIOR CERVICAL DISCECTOMY FUSION C5-6 with plates, screws, allograft bone graft, local bone graft and Vivigen    SURGEON:  Jessy Oto, MD     ASSISTANT:  Benjiman Core, PA-C  (Present throughout the entire procedure and necessary for completion of procedure in a timely manner)     ANESTHESIA:  General, supplemented with local marcaine 0.5% 1:1 exparel 1.3% total 10cc, Dr. Conrad Gaines.    COMPLICATIONS:  None.   EBL: 25CC    COMPONENTS:  Implant Name Type Inv. Item Serial No. Manufacturer Lot No. LRB No. Used Action  SPACER ADV ACF 9MM - [id number] Bone Implant SPACER ADV ACF 9MM [id number] [employer]OSKELE[employer] TRANSPLANT FNDN  N/A 1 Implanted  BONE VIVIGEN FORMABLE 1.3CC - [id number] Bone Implant BONE VIVIGEN FORMABLE 1.3CC [id number] [employer]  N/A 1 Implanted  PLATE ONE LEVEL SKYLINE 14MM - [id number] Plate PLATE ONE LEVEL SKYLINE 14MM  [employer] DEPU[employer]  N/A 1 Implanted  SCREW SKYLINE 14MM SD-VA - [id number] Screw SCREW SKYLINE 14MM SD-VA  [employer] SPINE  N/A 4 Implanted    PROCEDURE:The patient was met in the holding area, and the appropriate  left C5-6 cervical level identified and marked with an "x" and my initials. The patient was then transported to OR and was placed on the operative table in a supine position head supported on the well padded [place] horseshoe. The patient was then placed under  general anesthesia without difficulty intubated atraumaticly.      Cervical spine was positioned with a [place]field horseshoe and 5 pound cervical halter traction. All pressure points well padded and semi-beach chair position. Arm holder both arms.  Standard prep with DuraPrep solution the anterior cervical spine chest. Draped in the usual manner. Iodine vi drape was used. Standard timeout protocol was carried out identifying the patient procedure side of the procedure and level. The skin the left neck was infiltrated with Marcaine with epinephrine total of 10 cc. This at the level of expected C5-6 incision and also along a skin crease in line with the patients lines of Langer. Incision transverse at the C6 level and carried down to the level of the platysma. Then was carried down to the anterior aspect of the sternocleidomastoid muscle. The interval between the trachea and esophagus medially and the carotid sheath laterally was developed as a Metzenbaum scissors and blunt dissection exposing the anterior aspect of cervical spine at the C6 level.  The prevertebral fascia anterior to the cervical was cauterized with bipolar and teased across the midline with a Art therapist.  18-gauge spinal needles x 2 were placed with sheath intact allowing only a centimeter to extend into the C5-6 disc and C4-5 area below the disc and observed on lateral radiogragh  at the C5-6 level and just in the anterior body of C5 below the C4-5 disc space.  Handheld Cloward retraction of the soft tissues allowing removal of the spinal needles while identifying the level at C5-6 and also while removing a portion of the anterior aspect of the disc with15 blade scalpel and pituitary. Medial border of the longus collie muscles was carefully elevated bilaterally and self-retaining retractors were introduced the foot of the blade beneath the medial border  of the longus colli muscles. Soft tissue overlying the anterior borders of the disc space at C5-6 level carefully debridement of soft tissue back to bony edges. The anterior lip osteophytes were then resected using rongeur. This bone was preserved for later bone grafting purposes. The disc space was then first prepared using loupe  magnification and headlight with resection of degenerative disc annulus anteriorly and posteriorly and cartilaginous endplates using micro-curettes pituitaries and a high-speed bur. The operating room microscope was draped sterilely and brought into the field. Under the operating room microscope and posterior aspect of the disc was excised using micro-curettes pituitary rongeur and times per. Posterior lip osteophytes were drilled using a high-speed bur and a carefully resected with 1 and 2 mm Kerrison foraminotomy performed over both C6 nerve roots using 1 and 2 mm Kerrisons disc herniation material was noted centrally and into the left foramen some of which represented reflected portion of the patient's annulus into the neuroforamen. Following decompression of the spinal cord and both C6 nerve roots, irrigation was carried out. The endplates of the inferior aspect of C5 and superior aspect of C6 were carefully prepared using a high-speed bur to parallel. The disc space was then sounded utilized and a precontoured sounder for the transgraft implant. Surrounding was carried up to a 84m implant.  9 mm transgraft spacer was felt to provide best fit for the C5-6 disc space. The ACDF allograft permanent lordotic allograft implant was then brought onto the field. It was then packed with local bone graft that had been harvested previously. The implant was then carefully placed over the intervertebral disc space at C5-6 level. Care taken to ensure that no bone or soft tissue debris within the disc space that could be retropulsed with insertion of the cage and bone graft. The implant was then impacted into place with the head placed in longitudinal cervical traction.  The implant was felt to be in excellent position alignment. Cervical distraction instrumentation was removed and the screw post holes coated with wax for hemostasis. Length of the plate was then chosen using bone wax applied to a cottonoid string and measuring  from the edge of the lower cervical vertebral border of C5 the upper cervical border of C6  A  14 millimeter plate was chosen. 14 mm screws were then placed into the C5  locking the plate in place. Additional 14 mm screws were then placed in the C6 level  cervical traction had been released prior to screw placement. Intraoperative lateral radiograph demonstrated the plates and screws in good position alignment no sign of impingement upon the cervical canal. The graft appeared in good position alignment.  Upper extremity longitudinal traction using wrist restraints was necessary to obtain visualization of the lower cervical level.  This point irrigation was carried out at cervical incision site.The esophagus examined at the cervical level  and found to be normal. Irrigation was again carried out there was no active bleeding present. The incisions were then closed by approximating the deep subcutaneous layers the platysma layer with interrupted 3-0 Vicryl suture and the superficial fascia overlying the sternocleidomastoid muscle with interrupted 3-0 Vicryl sutures. The subcutaneous layers were approximated with interrupted 3-0 Vicryl sutures as were the superficial layers. The skin was closed with a running subcutaneous stitch of 4-0 Vicryl at the operative C5-6 transverse incision site. Skin was approximated with Dermabond. Mepilex bandage was applied. A [place] collar was then applied to the cervical spine released on the cervical spine and drapes were  removed. Physician assistant's responsibilities:Aharon Carriere Ricard Dillon, PA-C perform the duties of assistant physician and surgeon during this case present from the beginning of the case to the end of the case. He assisted with careful retraction of neural structures suctioning about her elements including cervical cord and C6 nerve roots. Performed closure of the incision on the ligamentum nuchae to the skin and application of dressing. He assisted in positioning the  patient had removal the patient from the OR table to her stretcher.     [name] 06/22/2019, 10:13 AM

## 2019-06-22 NOTE — Anesthesia Preprocedure Evaluation (Signed)
Anesthesia Evaluation  Patient identified by MRN, date of birth, ID band Patient awake    Reviewed: Allergy & Precautions, NPO status , Patient's Chart, lab work & pertinent test results  Airway Mallampati: I  TM Distance: >3 FB Neck ROM: Full    Dental   Pulmonary    Pulmonary exam normal        Cardiovascular Normal cardiovascular exam     Neuro/Psych Anxiety    GI/Hepatic GERD  Medicated and Controlled,  Endo/Other    Renal/GU      Musculoskeletal   Abdominal   Peds  Hematology   Anesthesia Other Findings   Reproductive/Obstetrics                             Anesthesia Physical Anesthesia Plan  ASA: II  Anesthesia Plan: General   Post-op Pain Management:    Induction: Intravenous  PONV Risk Score and Plan: 2  Airway Management Planned: Oral ETT  Additional Equipment:   Intra-op Plan:   Post-operative Plan: Extubation in OR  Informed Consent: I have reviewed the patients History and Physical, chart, labs and discussed the procedure including the risks, benefits and alternatives for the proposed anesthesia with the patient or authorized representative who has indicated his/her understanding and acceptance.       Plan Discussed with: CRNA and Surgeon  Anesthesia Plan Comments:         Anesthesia Quick Evaluation

## 2019-06-22 NOTE — Interval H&P Note (Signed)
History and Physical Interval Note:  06/22/2019 7:29 AM  Adam Hernandez  has presented today for surgery, with the diagnosis of C5-6 herniated disc, Degenerative Disc Disease C4-5, C6-7.  The various methods of treatment have been discussed with the patient and family. After consideration of risks, benefits and other options for treatment, the patient has consented to  Procedure(s): ANTERIOR CERVICAL DISCECTOMY FUSION C5-6 with plates, screws, allograft bone graft, local bone graft and Vivigen (N/A) as a surgical intervention.  The patient's history has been reviewed, patient examined, no change in status, stable for surgery.  I have reviewed the patient's chart and labs.  Questions were answered to the patient's satisfaction.     [name]

## 2019-06-22 NOTE — H&P (Signed)
Render Adam Hernandez is an 53 y.o. male.   Chief Complaint: neck pain and upper UE radiculopathy HPI: 53 year old white male with history of C5-6 HNP/stenosis, neck pain and left greater than right upper extremity radiculopathy comes in for preop evaluation.  He continues to have pain radiating down to the left hand along with discomfort radiating down to the right elbow.  He is wanting to proceed with C5-6 ACDF as scheduled.  Past Medical History:  Diagnosis Date  . Anxiety   . Arm numbness left    cervical disc issues  . Arthritis    right leg  . GERD (gastroesophageal reflux disease)   . Motion sickness    ocean boats  . Neuromuscular disorder ([place])   . Neuropathy   . Obesity (BMI 35.0-39.9 without comorbidity) 09/02/2017    Past Surgical History:  Procedure Laterality Date  . COLONOSCOPY WITH PROPOFOL N/A 04/30/2019   Procedure: COLONOSCOPY WITH BIOPSY;  Surgeon: Lucilla Lame, MD;  Location: [place];  Service: Endoscopy;  Laterality: N/A;  . LEG SURGERY Right 2017, 2018   fractured leg and ankle- has screws and pins  . POLYPECTOMY N/A 04/30/2019   Procedure: POLYPECTOMY;  Surgeon: Lucilla Lame, MD;  Location: [place];  Service: Endoscopy;  Laterality: N/A;  . SHOULDER SURGERY Left 2013   labrial tear  . WISDOM TOOTH EXTRACTION      Family History  Adopted: Yes   Social History:  reports that he has never smoked. He has never used smokeless tobacco. He reports current alcohol use of about 1.0 standard drinks of alcohol per week. He reports that he does not use drugs.  Allergies: No Known Allergies  Facility-Administered Medications Prior to Admission  Medication Dose Route Frequency Provider Last Rate Last Dose  . methylPREDNISolone acetate (DEPO-MEDROL) injection 80 mg  80 mg Other Once Magnus Sinning, MD       Medications Prior to Admission  Medication Sig Dispense Refill  . celecoxib (CELEBREX) 200 MG capsule Take 200 mg by mouth daily.     .  diclofenac sodium (VOLTAREN) 1 % GEL Apply 1 application topically 2 (two) times daily. Pain management    . gabapentin (NEURONTIN) 400 MG capsule Take 400 mg by mouth 4 (four) times daily. Pain management    . Ibuprofen-diphenhydrAMINE HCl (IBUPROFEN PM) 200-25 MG CAPS Take 3 tablets by mouth at bedtime as needed (pain).    . sildenafil (REVATIO) 20 MG tablet sildenafil (pulmonary hypertension) 20 mg tablet  Take 2 tablets by mouth one hour prior to sex as needed. Do not take more than one dose every 24 hours.    . traMADol (ULTRAM) 50 MG tablet Take 50 mg by mouth 3 (three) times daily with meals as needed for moderate pain.      No results found for this or any previous visit (from the past 48 hour(s)). No results found.  Review of Systems  Constitutional: Negative.   HENT: Negative.   Respiratory: Negative.   Cardiovascular: Negative.   Gastrointestinal: Negative.   Genitourinary: Negative.   Musculoskeletal: Positive for neck pain.  Skin: Negative.   Neurological: Positive for tingling.  Psychiatric/Behavioral: Negative.     Blood pressure (!) 166/98, pulse 81, temperature 98.4 F (36.9 C), temperature source Oral, resp. rate 20, height 6' (1.829 m), weight 125 kg, SpO2 97 %. Physical Exam  Constitutional: He is oriented to person, place, and time. No distress.  HENT:  Head: Normocephalic and atraumatic.  Eyes: Pupils are equal, round,  and reactive to light. EOM are normal.  Cardiovascular: Normal heart sounds.  Respiratory: Effort normal. No respiratory distress.  GI: Bowel sounds are normal. He exhibits no distension. There is no abdominal tenderness.  Musculoskeletal:        General: Tenderness present.  Neurological: He is alert and oriented to person, place, and time.  Skin: Skin is warm and dry.  Psychiatric: He has a normal mood and affect.     Assessment/Plan C5-6 HNP/stenosis  willl proceed with C5-6 acdf as scheduled.  Surgical procedure discussed in detail  and all questions answered.    Benjiman Core, PA-C 06/22/2019, 6:10 AM

## 2019-06-22 NOTE — Transfer of Care (Signed)
Immediate Anesthesia Transfer of Care Note  Patient: Adam Hernandez  Procedure(s) Performed: ANTERIOR CERVICAL DISCECTOMY FUSION C5-6 with plates, screws, allograft bone graft, local bone graft and Vivigen (N/A Neck)  Patient Location: PACU  Anesthesia Type:General  Level of Consciousness: awake, alert , oriented and sedated  Airway & Oxygen Therapy: Patient Spontanous Breathing and Patient connected to nasal cannula oxygen  Post-op Assessment: Report given to RN, Post -op Vital signs reviewed and stable and Patient moving all extremities  Post vital signs: Reviewed and stable  Last Vitals:  Vitals Value Taken Time  BP 147/85 06/22/19 1055  Temp 36.8 C 06/22/19 1054  Pulse 93 06/22/19 1058  Resp 11 06/22/19 1058  SpO2 98 % 06/22/19 1058  Vitals shown include unvalidated device data.  Last Pain:  Vitals:   06/22/19 1054  TempSrc:   PainSc: (P) 8       Patients Stated Pain Goal: 2 (90/38/33 3832)  Complications: No apparent anesthesia complications

## 2019-06-22 NOTE — Discharge Instructions (Signed)
No lifting greater than 10 lbs. No overhead use of arms. °Avoid bending,and twisting neck. °Walk in house for first week them may start to get out slowly increasing distance up to one mile by 3 weeks post op. °Keep incision dry for 3 days, may then bathe and wet incision using a [place] collar when showering. °Call if any fevers >101, chills, or increasing numbness or weakness or increased swelling or drainage. ° °

## 2019-06-22 NOTE — Anesthesia Procedure Notes (Addendum)
Procedure Name: Intubation Date/Time: 06/22/2019 7:58 AM Performed by: Scheryl Darter, CRNA Pre-anesthesia Checklist: Patient identified, Emergency Drugs available, Suction available and Patient being monitored Patient Re-evaluated:Patient Re-evaluated prior to induction Oxygen Delivery Method: Circle System Utilized Preoxygenation: Pre-oxygenation with 100% oxygen Induction Type: IV induction Ventilation: Mask ventilation without difficulty Laryngoscope Size: Miller, 3 and Mac Grade View: Grade IV Tube type: Oral Tube size: 7.5 mm Number of attempts: 2 Airway Equipment and Method: Stylet and Oral airway Placement Confirmation: ETT inserted through vocal cords under direct vision,  positive ETCO2 and breath sounds checked- equal and bilateral Secured at: 23 cm Tube secured with: Tape Dental Injury: Teeth and Oropharynx as per pre-operative assessment  Difficulty Due To: Difficult Airway- due to anterior larynx, Difficult Airway- due to limited oral opening, Difficult Airway- due to large tongue and Difficulty was anticipated

## 2019-06-22 NOTE — Evaluation (Signed)
Physical Therapy Evaluation Patient Details Name: Adam Hernandez MRN: [id number] DOB: August 22, 1966 Today's Date: 06/22/2019   History of Present Illness  Pt is a 53 y/o male s/p C5-6 ACDF. PMH includes neuropathy and R foot injury.   Clinical Impression  Patient is s/p above surgery resulting in the deficits listed below (see PT Problem List). Pt guarded during gait and presenting with R foot deficits at baseline. Pt requiring gross min guard for mobility this session, and reporting increased pain with ambulation. Educated about cervical precautions and generalized walking program to perform at home. Patient will benefit from skilled PT to increase their independence and safety with mobility (while adhering to their precautions) to allow discharge to the venue listed below.     Follow Up Recommendations No PT follow up;Supervision for mobility/OOB    Equipment Recommendations  None recommended by PT    Recommendations for Other Services       Precautions / Restrictions Precautions Precautions: Cervical Precaution Booklet Issued: Yes (comment) Precaution Comments: Reviewed cervical precautions with pt.  Required Braces or Orthoses: Cervical Brace Cervical Brace: Soft collar Restrictions Weight Bearing Restrictions: No      Mobility  Bed Mobility Overal bed mobility: Needs Assistance Bed Mobility: Supine to Sit;Sit to Supine     Supine to sit: Min guard Sit to supine: Min guard   General bed mobility comments: Min guard to ensure log roll technique. Cues for sequencing.   Transfers Overall transfer level: Needs assistance Equipment used: None Transfers: Sit to/from Stand Sit to Stand: Min assist         General transfer comment: Light min A for steadying assist.   Ambulation/Gait Ambulation/Gait assistance: Min guard Gait Distance (Feet): 200 Feet Assistive device: None Gait Pattern/deviations: Step-through pattern;Decreased stride length;Decreased weight shift  to right Gait velocity: Decreased   General Gait Details: Slow, cautious gait. Mild unsteadiness noted; min guard for safety throughout. Educated about generalized walking program to perform at home.   Stairs            Wheelchair Mobility    Modified Rankin (Stroke Patients Only)       Balance Overall balance assessment: Needs assistance Sitting-balance support: No upper extremity supported;Feet supported Sitting balance-Leahy Scale: Good     Standing balance support: No upper extremity supported;During functional activity Standing balance-Leahy Scale: Fair                               Pertinent Vitals/Pain Pain Assessment: 0-10 Pain Score: 5  Pain Location: neck Pain Descriptors / Indicators: Aching;Operative site guarding Pain Intervention(s): Limited activity within patient's tolerance;Monitored during session;Repositioned    Home Living Family/patient expects to be discharged to:: Private residence Living Arrangements: Spouse/significant other Available Help at Discharge: Family;Available PRN/intermittently Type of Home: House Home Access: Stairs to enter Entrance Stairs-Rails: None Entrance Stairs-Number of Steps: 1 Home Layout: Two level Home Equipment: Cane - single point      Prior Function Level of Independence: Independent with assistive device(s)         Comments: Reports occasional use of cane     Hand Dominance        Extremity/Trunk Assessment   Upper Extremity Assessment Upper Extremity Assessment: Defer to OT evaluation    Lower Extremity Assessment Lower Extremity Assessment: RLE deficits/detail RLE Deficits / Details: R foot deficits at baseline. Pt reports decreased sensation in R foot and reports he has had multiple surgeries.  Cervical / Trunk Assessment Cervical / Trunk Assessment: Other exceptions Cervical / Trunk Exceptions: s/p ACDF  Communication   Communication: No difficulties  Cognition  Arousal/Alertness: Awake/alert Behavior During Therapy: WFL for tasks assessed/performed Overall Cognitive Status: Within Functional Limits for tasks assessed                                        General Comments      Exercises     Assessment/Plan    PT Assessment Patient needs continued PT services  PT Problem List Decreased mobility;Decreased activity tolerance;Decreased balance;Decreased strength;Decreased knowledge of precautions;Pain;Impaired sensation       PT Treatment Interventions Gait training;Stair training;Functional mobility training;Therapeutic activities;Therapeutic exercise;Balance training;Patient/family education    PT Goals (Current goals can be found in the Care Plan section)  Acute Rehab PT Goals Patient Stated Goal: to go home PT Goal Formulation: With patient Time For Goal Achievement: 07/06/19 Potential to Achieve Goals: Good    Frequency Min 5X/week   Barriers to discharge        Co-evaluation               AM-PAC PT "6 Clicks" Mobility  Outcome Measure Help needed turning from your back to your side while in a flat bed without using bedrails?: None Help needed moving from lying on your back to sitting on the side of a flat bed without using bedrails?: A Little Help needed moving to and from a bed to a chair (including a wheelchair)?: A Little Help needed standing up from a chair using your arms (e.g., wheelchair or bedside chair)?: A Little Help needed to walk in hospital room?: A Little Help needed climbing 3-5 steps with a railing? : A Little 6 Click Score: 19    End of Session Equipment Utilized During Treatment: Gait belt;Cervical collar Activity Tolerance: Patient tolerated treatment well Patient left: in bed;with call bell/phone within reach Nurse Communication: Mobility status PT Visit Diagnosis: Unsteadiness on feet (R26.81);Pain Pain - part of body: (neck )    Time: 3559-7416 PT Time Calculation (min)  (ACUTE ONLY): 17 min   Charges:   PT Evaluation $PT Eval Low Complexity: [place], PT, DPT  Acute Rehabilitation Services  Pager: [phone] Office: [phone]   Rudean Hitt 06/22/2019, 3:24 PM

## 2019-06-22 NOTE — Anesthesia Postprocedure Evaluation (Signed)
Anesthesia Post Note  Patient: Rayon Mcchristian Feuerborn  Procedure(s) Performed: ANTERIOR CERVICAL DISCECTOMY FUSION C5-6 with plates, screws, allograft bone graft, local bone graft and Vivigen (N/A Neck)     Patient location during evaluation: PACU Anesthesia Type: General Level of consciousness: awake and alert Pain management: pain level controlled Vital Signs Assessment: post-procedure vital signs reviewed and stable Respiratory status: spontaneous breathing, nonlabored ventilation, respiratory function stable and patient connected to nasal cannula oxygen Cardiovascular status: blood pressure returned to baseline and stable Postop Assessment: no apparent nausea or vomiting Anesthetic complications: no    Last Vitals:  Vitals:   06/22/19 1155 06/22/19 1730  BP: 135/86 139/75  Pulse: 85 82  Resp: 20 18  Temp: 36.9 C 36.8 C  SpO2: 95% 98%    Last Pain:  Vitals:   06/22/19 1826  TempSrc:   PainSc: 7                  Fredie Majano DAVID

## 2019-06-23 ENCOUNTER — Encounter (HOSPITAL_COMMUNITY): Payer: Self-pay | Admitting: Specialist

## 2019-06-23 DIAGNOSIS — M4802 Spinal stenosis, cervical region: Secondary | ICD-10-CM | POA: Diagnosis not present

## 2019-06-23 DIAGNOSIS — M50121 Cervical disc disorder at C4-C5 level with radiculopathy: Secondary | ICD-10-CM | POA: Diagnosis not present

## 2019-06-23 DIAGNOSIS — M50321 Other cervical disc degeneration at C4-C5 level: Secondary | ICD-10-CM | POA: Diagnosis not present

## 2019-06-23 DIAGNOSIS — K219 Gastro-esophageal reflux disease without esophagitis: Secondary | ICD-10-CM | POA: Diagnosis not present

## 2019-06-23 DIAGNOSIS — R2689 Other abnormalities of gait and mobility: Secondary | ICD-10-CM | POA: Diagnosis not present

## 2019-06-23 DIAGNOSIS — M47812 Spondylosis without myelopathy or radiculopathy, cervical region: Secondary | ICD-10-CM | POA: Diagnosis not present

## 2019-06-23 DIAGNOSIS — R2681 Unsteadiness on feet: Secondary | ICD-10-CM | POA: Diagnosis not present

## 2019-06-23 DIAGNOSIS — M50221 Other cervical disc displacement at C4-C5 level: Secondary | ICD-10-CM | POA: Diagnosis not present

## 2019-06-23 DIAGNOSIS — F419 Anxiety disorder, unspecified: Secondary | ICD-10-CM | POA: Diagnosis not present

## 2019-06-23 MED ORDER — METHOCARBAMOL 500 MG PO TABS: 500.0000 mg | ORAL_TABLET | Freq: Four times a day (QID) | ORAL | 0 refills | Status: DC | PRN

## 2019-06-23 MED ORDER — PANTOPRAZOLE SODIUM 40 MG PO TBEC: 40.0000 mg | DELAYED_RELEASE_TABLET | Freq: Every day | ORAL | Status: DC

## 2019-06-23 MED ORDER — OXYCODONE-ACETAMINOPHEN 5-325 MG PO TABS: 1.0000 | ORAL_TABLET | Freq: Four times a day (QID) | ORAL | 0 refills | Status: DC | PRN

## 2019-06-23 MED FILL — Thrombin (Recombinant) For Soln 20000 Unit: CUTANEOUS | Qty: 1 | Status: AC

## 2019-06-23 NOTE — Evaluation (Signed)
Occupational Therapy Evaluation Patient Details Name: Adam Hernandez MRN: [id number] DOB: 1966/03/01 Today's Date: 06/23/2019    History of Present Illness Pt is a 52 y/o male s/p C5-6 ACDF. PMH includes neuropathy and R foot injury.    Clinical Impression   Pt is functioning modified independently. Reinforced cervical precautions and fall prevention. Pt verbalized understanding. No further OT needs.    Follow Up Recommendations  No OT follow up    Equipment Recommendations  None recommended by OT    Recommendations for Other Services       Precautions / Restrictions Precautions Precautions: Cervical;Fall Precaution Booklet Issued: Yes (comment) Precaution Comments: Reviewed cervical precautions with pt.  Required Braces or Orthoses: Cervical Brace Cervical Brace: Soft collar Restrictions Weight Bearing Restrictions: No      Mobility Bed Mobility Overal bed mobility: Modified Independent                Transfers Overall transfer level: Modified independent Equipment used: None                  Balance Overall balance assessment: Needs assistance Sitting-balance support: No upper extremity supported;Feet supported Sitting balance-Leahy Scale: Good       Standing balance-Leahy Scale: Fair                             ADL either performed or assessed with clinical judgement   ADL Overall ADL's : Modified independent                                       General ADL Comments: educated in compensatory strategies for ADL and fall prevention     Vision Patient Visual Report: No change from baseline       Perception     Praxis      Pertinent Vitals/Pain Pain Assessment: Faces Faces Pain Scale: Hurts little more Pain Location: neck Pain Descriptors / Indicators: Aching;Operative site guarding Pain Intervention(s): Monitored during session;Repositioned     Hand Dominance Right   Extremity/Trunk Assessment  Upper Extremity Assessment Upper Extremity Assessment: Overall WFL for tasks assessed   Lower Extremity Assessment Lower Extremity Assessment: Defer to PT evaluation RLE Deficits / Details: R foot deficits at baseline. Pt reports decreased sensation in R foot and reports he has had multiple surgeries.    Cervical / Trunk Assessment Cervical / Trunk Assessment: Other exceptions Cervical / Trunk Exceptions: s/p ACDF   Communication Communication Communication: No difficulties   Cognition Arousal/Alertness: Awake/alert Behavior During Therapy: WFL for tasks assessed/performed Overall Cognitive Status: Within Functional Limits for tasks assessed                                     General Comments       Exercises     Shoulder Instructions      Home Living Family/patient expects to be discharged to:: Private residence Living Arrangements: Spouse/significant other;Children Available Help at Discharge: Family;Available PRN/intermittently Type of Home: House Home Access: Stairs to enter [employer] of Steps: 1 Entrance Stairs-Rails: None Home Layout: Two level Alternate Level Stairs-Number of Steps: 8 Alternate Level Stairs-Rails: Right Bathroom Shower/Tub: Tub/shower unit;Walk-in shower   Bathroom Toilet: Handicapped height     Home Equipment: [place] - single point;Shower seat  Prior Functioning/Environment Level of Independence: Independent with assistive device(s)        Comments: Reports occasional use of cane        OT Problem List:        OT Treatment/Interventions:      OT Goals(Current goals can be found in the care plan section) Acute Rehab OT Goals Patient Stated Goal: to go home  OT Frequency:     Barriers to D/C:            Co-evaluation              AM-PAC OT "6 Clicks" Daily Activity     Outcome Measure Help from another person eating meals?: None Help from another person taking care of personal  grooming?: None Help from another person toileting, which includes using toliet, bedpan, or urinal?: None Help from another person bathing (including washing, rinsing, drying)?: None Help from another person to put on and taking off regular upper body clothing?: None Help from another person to put on and taking off regular lower body clothing?: None 6 Click Score: 24   End of Session Equipment Utilized During Treatment: Cervical collar  Activity Tolerance: Patient tolerated treatment well Patient left: in bed;with call bell/phone within reach  OT Visit Diagnosis: Other abnormalities of gait and mobility (R26.89);Pain                Time: 8341-9622 OT Time Calculation (min): 20 min Charges:  OT General Charges $OT Visit: 1 Visit OT Evaluation $OT Eval Low Complexity: 1 Low  Nestor Lewandowsky, OTR/L Acute Rehabilitation Services Pager: [phone] Office: [phone]  Malka So 06/23/2019, 9:14 AM

## 2019-06-23 NOTE — Progress Notes (Signed)
Pt doing well. Pt and wife given D/C instructions with verbal understanding. Rx's were sent to pharmacy by MD. Pt's incision is clean and dry with no sign of infection. Pt's IV was removed prior to D/C. Pt D/C'd home via wheelchair per MD order. Pt is stable @ D/C and has no other needs at this time. Elisheba Mcdonnell, RN  

## 2019-06-23 NOTE — Progress Notes (Signed)
Physical Therapy Treatment Patient Details Name: Adam Hernandez MRN: [id number] DOB: 1966-04-28 Today's Date: 06/23/2019    History of Present Illness Pt is a 53 y/o male s/p C5-6 ACDF. PMH includes neuropathy and R foot injury.     PT Comments    Pt progressing well with post-op mobility. He reports decreased pain and is functioning at an overall mod I level without an AD for transfers and ambulation. Pt completed stair training this session as well with supervision for safety. We reinforced education regarding car transfer, activity progression, and use of SPC as needed at home. As pt has met acute PT goals, we will sign off at this time. If needs change, please reconsult.    Follow Up Recommendations  No PT follow up;Supervision for mobility/OOB     Equipment Recommendations  None recommended by PT    Recommendations for Other Services       Precautions / Restrictions Precautions Precautions: Cervical;Fall Precaution Booklet Issued: Yes (comment) Precaution Comments: Reviewed cervical precautions with pt.  Required Braces or Orthoses: Cervical Brace Cervical Brace: Soft collar Restrictions Weight Bearing Restrictions: No    Mobility  Bed Mobility Overal bed mobility: Modified Independent Bed Mobility: Supine to Sit;Sit to Supine           General bed mobility comments: No assist. Reviewed log roll for when pt gets into bed at home but planning on sleeping in the recliner for a few days at home.   Transfers Overall transfer level: Modified independent Equipment used: None Transfers: Sit to/from Stand           General transfer comment: No assist required. No unsteaduness or LOB noted.   Ambulation/Gait Ambulation/Gait assistance: Modified independent (Device/Increase time) Gait Distance (Feet): 400 Feet Assistive device: None Gait Pattern/deviations: Step-through pattern;Decreased stride length;Decreased weight shift to right Gait velocity:  Decreased Gait velocity interpretation: 1.31 - 2.62 ft/sec, indicative of limited community ambulator General Gait Details: Slow but generally steady without AD.    Stairs Stairs: Yes Stairs assistance: Supervision Stair Management: One rail Right;Step to pattern;Sideways Number of Stairs: 10 General stair comments: VC's for sequencing and general safety.    Wheelchair Mobility    Modified Rankin (Stroke Patients Only)       Balance Overall balance assessment: Needs assistance Sitting-balance support: No upper extremity supported;Feet supported Sitting balance-Leahy Scale: Good     Standing balance support: No upper extremity supported;During functional activity Standing balance-Leahy Scale: Fair                              Cognition Arousal/Alertness: Awake/alert Behavior During Therapy: WFL for tasks assessed/performed Overall Cognitive Status: Within Functional Limits for tasks assessed                                        Exercises      General Comments        Pertinent Vitals/Pain Pain Assessment: Faces Faces Pain Scale: Hurts a little bit Pain Location: neck Pain Descriptors / Indicators: Operative site guarding Pain Intervention(s): Monitored during session    Home Living Family/patient expects to be discharged to:: Private residence Living Arrangements: Spouse/significant other;Children Available Help at Discharge: Family;Available PRN/intermittently Type of Home: House Home Access: Stairs to enter Entrance Stairs-Rails: None Home Layout: Two level Home Equipment: Cane - single point;Shower seat  Prior Function Level of Independence: Independent with assistive device(s)      Comments: Reports occasional use of cane   PT Goals (current goals can now be found in the care plan section) Acute Rehab PT Goals Patient Stated Goal: to go home PT Goal Formulation: With patient Time For Goal Achievement:  07/06/19 Potential to Achieve Goals: Good Progress towards PT goals: Progressing toward goals    Frequency    Min 5X/week      PT Plan Current plan remains appropriate    Co-evaluation              AM-PAC PT "6 Clicks" Mobility   Outcome Measure  Help needed turning from your back to your side while in a flat bed without using bedrails?: None Help needed moving from lying on your back to sitting on the side of a flat bed without using bedrails?: None Help needed moving to and from a bed to a chair (including a wheelchair)?: None Help needed standing up from a chair using your arms (e.g., wheelchair or bedside chair)?: None Help needed to walk in hospital room?: None Help needed climbing 3-5 steps with a railing? : A Little 6 Click Score: 23    End of Session Equipment Utilized During Treatment: Gait belt;Cervical collar Activity Tolerance: Patient tolerated treatment well Patient left: in bed;with call bell/phone within reach Nurse Communication: Mobility status PT Visit Diagnosis: Unsteadiness on feet (R26.81);Pain Pain - part of body: (neck )     Time: 8883-5844 PT Time Calculation (min) (ACUTE ONLY): 16 min  Charges:  $Gait Training: 8-22 mins                     Rolinda Roan, PT, DPT Acute Rehabilitation Services Pager: [phone] Office: [phone]    Thelma Comp 06/23/2019, 10:41 AM

## 2019-06-23 NOTE — Progress Notes (Signed)
Subjective: Doing well.  Pain controlled.  Good hall ambulation.  No voiding issues. Wants to go home.    Objective: Vital signs in last 24 hours: Temp:  [97.7 F (36.5 C)-98.4 F (36.9 C)] 97.7 F (36.5 C) (08/11 0732) Pulse Rate:  [63-97] 63 (08/11 0732) Resp:  [14-20] 18 (08/11 0732) BP: (127-150)/(69-91) 135/76 (08/11 0732) SpO2:  [91 %-100 %] 99 % (08/11 0732)  Intake/Output from previous day: 08/10 0701 - 08/11 0700 In: 1203 [I.V.:1103; IV Piggyback:100] Out: 25 [Blood:25] Intake/Output this shift: No intake/output data recorded.  No results for input(s): HGB in the last 72 hours. No results for input(s): WBC, RBC, HCT, PLT in the last 72 hours. No results for input(s): NA, K, CL, CO2, BUN, CREATININE, GLUCOSE, CALCIUM in the last 72 hours. No results for input(s): LABPT, INR in the last 72 hours.  Exam: Very pleasant male, alert and oriented. NAD.  Neurologically intact.  No motor deficits.     Assessment/Plan: D/c home today.  Scripts for [employer] and robaxin sent to his pharmacy.  F/u with Dr Louanne Skye in 2 weeks.  Return sooner if needed.  Cervical collar on at all times.      Adam Hernandez 06/23/2019, 10:32 AM

## 2019-06-25 NOTE — Discharge Summary (Signed)
Physician Discharge Summary      Patient ID: Adam Hernandez MRN: [id number] DOB/AGE: 04/10/1966 53 y.o.  Admit date: 06/22/2019 Discharge date: 06/23/2019  Admission Diagnoses:  Active Problems:   Spondylosis without myelopathy or radiculopathy, cervical region   Herniation of cervical intervertebral disc with radiculopathy   Status post cervical spinal fusion   Discharge Diagnoses:  Same  Past Medical History:  Diagnosis Date  . Anxiety   . Arm numbness left    cervical disc issues  . Arthritis    right leg  . GERD (gastroesophageal reflux disease)   . Motion sickness    ocean boats  . Neuromuscular disorder ([place])   . Neuropathy   . Obesity (BMI 35.0-39.9 without comorbidity) 09/02/2017    Surgeries: Procedure(s): ANTERIOR CERVICAL DISCECTOMY FUSION C5-6 with plates, screws, allograft bone graft, local bone graft and Vivigen on 06/22/2019   Consultants:   Discharged Condition: Improved  Hospital Course: Adam Hernandez is an 53 y.o. male who was admitted 06/22/2019 with a chief complaint of No chief complaint on file. , and found to have a diagnosis of C5-6 cervical herniated disc with cervical radiculopathy.   He was brought to the operating room on 06/22/2019 and underwent the above named procedures.    He was given perioperative antibiotics:  Anti-infectives (From admission, onward)   Start     Dose/Rate Route Frequency Ordered Stop   06/22/19 1200  ceFAZolin (ANCEF) IVPB 2g/100 mL premix     2 g 200 mL/hr over 30 Minutes Intravenous Every 8 hours 06/22/19 1146 06/22/19 2049   06/22/19 0630  ceFAZolin (ANCEF) 3 g in dextrose 5 % 50 mL IVPB     3 g 100 mL/hr over 30 Minutes Intravenous To ShortStay Surgical 06/19/19 1133 06/22/19 1225    Recovered uneventfully in the PACU and was transferred to [facility] 3c bed 11 post recovery.  Vital signs remained stable and he was initiated on a clear liquid diet and advanced to soft diet. His was able to void without  difficulty. POD#1 minimal sore throat, pain controlled with oral meds. Neurovascular normal exam. Dressing changed with minimal post surgical swelling soft C-collar may be used when out of bed. He was discharged home on POD#1.  He was given sequential compression devices and early ambulation for DVT prophylaxis.  They benefited maximally from their hospital stay and there were no complications.    Recent vital signs:  Vitals:   06/23/19 0432 06/23/19 0732  BP: 127/79 135/76  Pulse: 69 63  Resp: 20 18  Temp: 97.9 F (36.6 C) 97.7 F (36.5 C)  SpO2: 96% 99%    Recent laboratory studies:  Results for orders placed or performed during the hospital encounter of 06/18/19  SARS CORONAVIRUS 2 Nasal Swab Aptima Multi Swab   Specimen: Aptima Multi Swab; Nasal Swab  Result Value Ref Range   SARS Coronavirus 2 NEGATIVE NEGATIVE    Discharge Medications:   Allergies as of 06/23/2019   No Known Allergies     Medication List    STOP taking these medications   CeleBREX 200 MG capsule Generic drug: celecoxib   diclofenac sodium 1 % Gel Commonly known as: VOLTAREN   Ibuprofen PM 200-25 MG Caps Generic drug: Ibuprofen-diphenhydrAMINE HCl   traMADol 50 MG tablet Commonly known as: ULTRAM     TAKE these medications   gabapentin 400 MG capsule Commonly known as: NEURONTIN Take 400 mg by mouth 4 (four) times daily. Pain management   methocarbamol  500 MG tablet Commonly known as: Robaxin Take 1 tablet (500 mg total) by mouth every 6 (six) hours as needed for muscle spasms.   oxyCODONE-acetaminophen 5-325 MG tablet Commonly known as: PERCOCET/ROXICET Take 1 tablet by mouth every 6 (six) hours as needed for severe pain.   sildenafil 20 MG tablet Commonly known as: REVATIO sildenafil (pulmonary hypertension) 20 mg tablet  Take 2 tablets by mouth one hour prior to sex as needed. Do not take more than one dose every 24 hours.       Diagnostic Studies: Dg Cervical Spine 2-3 Views   Result Date: 06/22/2019 CLINICAL DATA:  Anterior cervical spine fusion. EXAM: CERVICAL SPINE - 2-3 VIEW; DG C-ARM 61-120 MIN COMPARISON:  MRI 02/19/2019. FINDINGS: Lower cervical spine anterior fusion. Cervical vertebral bodies difficult to number due to positioning and technique. IMPRESSION: Lower cervical anterior spine fusion. Electronically Signed   By: Marcello Moores  Register   On: 06/22/2019 12:32   Dg C-arm 1-60 Min  Result Date: 06/22/2019 CLINICAL DATA:  Anterior cervical spine fusion. EXAM: CERVICAL SPINE - 2-3 VIEW; DG C-ARM 61-120 MIN COMPARISON:  MRI 02/19/2019. FINDINGS: Lower cervical spine anterior fusion. Cervical vertebral bodies difficult to number due to positioning and technique. IMPRESSION: Lower cervical anterior spine fusion. Electronically Signed   By: Marcello Moores  Register   On: 06/22/2019 12:32    Disposition:   Discharge Instructions    Incentive spirometry RT   Complete by: Jun 22, 2019       Follow-up Information    Jessy Oto, MD In 2 weeks.   Specialty: Orthopedic Surgery Why: For wound re-check Contact information: [place] [address] [phone]            Signed: [name] 06/25/2019, 6:00 PM

## 2019-06-29 ENCOUNTER — Ambulatory Visit: Payer: 59 | Admitting: Specialist

## 2019-07-01 ENCOUNTER — Other Ambulatory Visit: Payer: Self-pay

## 2019-07-01 NOTE — Telephone Encounter (Signed)
Please advise 

## 2019-07-03 MED ORDER — OXYCODONE-ACETAMINOPHEN 5-325 MG PO TABS: 1.0000 | ORAL_TABLET | ORAL | 0 refills | Status: DC | PRN

## 2019-07-06 ENCOUNTER — Ambulatory Visit (INDEPENDENT_AMBULATORY_CARE_PROVIDER_SITE_OTHER): Payer: 59

## 2019-07-06 ENCOUNTER — Encounter: Payer: Self-pay | Admitting: Specialist

## 2019-07-06 ENCOUNTER — Ambulatory Visit (INDEPENDENT_AMBULATORY_CARE_PROVIDER_SITE_OTHER): Payer: 59 | Admitting: Specialist

## 2019-07-06 VITALS — BP 119/68 | HR 62 | Ht 72.0 in | Wt 270.0 lb

## 2019-07-06 DIAGNOSIS — M47819 Spondylosis without myelopathy or radiculopathy, site unspecified: Secondary | ICD-10-CM

## 2019-07-06 DIAGNOSIS — Z981 Arthrodesis status: Secondary | ICD-10-CM

## 2019-07-06 DIAGNOSIS — M5010 Cervical disc disorder with radiculopathy, unspecified cervical region: Secondary | ICD-10-CM | POA: Diagnosis not present

## 2019-07-06 NOTE — Patient Instructions (Signed)
Avoid overhead lifting and overhead use of the arms. Do not lift greater than 5 lbs. Adjust head rest in vehicle to prevent hyperextension if rear ended. Take extra precautions to avoid falling.  

## 2019-07-06 NOTE — Progress Notes (Signed)
Post-Op Visit Note   Patient: Adam Hernandez           Date of Birth: 1966-08-28           MRN: [id number] Visit Date: 07/06/2019 PCP: Juline Patch, MD   Assessment & Plan: 14 days po ACDF C5-6  Chief Complaint:  Chief Complaint  Patient presents with  . Neck - Routine Post Op   Visit Diagnoses:  1. S/P cervical spinal fusion   2. Spondylosis without myelopathy or radiculopathy    Left neck incision is healed.  Arms are NV normal motor is normal some mild numbness right index through long finger. Steristrips removed.    Plan: Avoid overhead lifting and overhead use of the arms. Do not lift greater than 5 lbs. Adjust head rest in vehicle to prevent hyperextension if rear ended. Take extra precautions to avoid falling.  Follow-Up Instructions: Return in about 4 weeks (around 08/03/2019).   Orders:  Orders Placed This Encounter  Procedures  . XR Cervical Spine 2 or 3 views   No orders of the defined types were placed in this encounter.   Imaging: Xr Cervical Spine 2 Or 3 Views  Result Date: 07/06/2019 AP and lateral radiographs cervical spine with C5-6 ACDF with plate and screws. Bone plug in good position and alignment.   PMFS History: Patient Active Problem List   Diagnosis Date Noted  . Spondylosis without myelopathy or radiculopathy, cervical region 06/22/2019    Priority: Medium    Class: Chronic  . Herniation of cervical intervertebral disc with radiculopathy 06/22/2019  . Status post cervical spinal fusion 06/22/2019  . Encounter for screening colonoscopy   . Polyp of sigmoid colon   . Synovitis and tenosynovitis 01/06/2019  . Strain of peroneal tendon 12/23/2018  . Closed dislocation of ankle 04/29/2018  . Elevated blood pressure reading 09/02/2017  . Neuropathy of right ankle 09/02/2017  . Obesity (BMI 35.0-39.9 without comorbidity) 09/02/2017  . Injury of nerve of lower extremity 09/02/2017  . Closed bimalleolar fracture 09/26/2016  . Loose  body in ankle and foot joint 09/26/2016  . Sprain of deltoid ligament of ankle 09/26/2016   Past Medical History:  Diagnosis Date  . Anxiety   . Arm numbness left    cervical disc issues  . Arthritis    right leg  . GERD (gastroesophageal reflux disease)   . Motion sickness    ocean boats  . Neuromuscular disorder ([place])   . Neuropathy   . Obesity (BMI 35.0-39.9 without comorbidity) 09/02/2017    Family History  Adopted: Yes    Past Surgical History:  Procedure Laterality Date  . ANTERIOR CERVICAL DECOMP/DISCECTOMY FUSION N/A 06/22/2019   Procedure: ANTERIOR CERVICAL DISCECTOMY FUSION C5-6 with plates, screws, allograft bone graft, local bone graft and Vivigen;  Surgeon: Jessy Oto, MD;  Location: [facility];  Service: Orthopedics;  Laterality: N/A;  . COLONOSCOPY WITH PROPOFOL N/A 04/30/2019   Procedure: COLONOSCOPY WITH BIOPSY;  Surgeon: Lucilla Lame, MD;  Location: [facility];  Service: Endoscopy;  Laterality: N/A;  . LEG SURGERY Right 2017, 2018   fractured leg and ankle- has screws and pins  . POLYPECTOMY N/A 04/30/2019   Procedure: POLYPECTOMY;  Surgeon: Lucilla Lame, MD;  Location: [facility];  Service: Endoscopy;  Laterality: N/A;  . SHOULDER SURGERY Left 2013   labrial tear  . WISDOM TOOTH EXTRACTION     Social History   Occupational History  . Not on file  Tobacco Use  .  Smoking status: Never Smoker  . Smokeless tobacco: Never Used  Substance and Sexual Activity  . Alcohol use: Yes    Alcohol/week: 1.0 standard drinks    Types: 1 Cans of beer per week    Comment: occasional  . Drug use: Never  . Sexual activity: Yes

## 2019-07-13 ENCOUNTER — Other Ambulatory Visit: Payer: Self-pay

## 2019-07-13 ENCOUNTER — Other Ambulatory Visit: Payer: Self-pay | Admitting: Specialist

## 2019-07-13 MED ORDER — OXYCODONE-ACETAMINOPHEN 5-325 MG PO TABS: 1.0000 | ORAL_TABLET | ORAL | 0 refills | Status: DC | PRN

## 2019-07-13 MED ORDER — METHOCARBAMOL 500 MG PO TABS: 500.0000 mg | ORAL_TABLET | Freq: Four times a day (QID) | ORAL | 0 refills | Status: DC | PRN

## 2019-07-13 NOTE — Telephone Encounter (Signed)
[name] patient 

## 2019-07-16 ENCOUNTER — Encounter: Payer: Self-pay | Admitting: Family Medicine

## 2019-07-24 ENCOUNTER — Other Ambulatory Visit: Payer: Self-pay | Admitting: Specialist

## 2019-07-24 NOTE — Telephone Encounter (Signed)
Can you advise or hold for [name]?

## 2019-07-24 NOTE — Telephone Encounter (Signed)
Back to [place]. Thanks

## 2019-07-24 NOTE — Telephone Encounter (Signed)
Per Dr Lorin Mercy hold for Dr Louanne Skye.

## 2019-07-28 MED ORDER — OXYCODONE-ACETAMINOPHEN 5-325 MG PO TABS: 1.0000 | ORAL_TABLET | ORAL | 0 refills | Status: DC | PRN

## 2019-08-03 ENCOUNTER — Ambulatory Visit: Payer: 59 | Admitting: Specialist

## 2019-09-03 ENCOUNTER — Ambulatory Visit (INDEPENDENT_AMBULATORY_CARE_PROVIDER_SITE_OTHER): Payer: PRIVATE HEALTH INSURANCE

## 2019-09-03 ENCOUNTER — Ambulatory Visit (INDEPENDENT_AMBULATORY_CARE_PROVIDER_SITE_OTHER): Payer: PRIVATE HEALTH INSURANCE | Admitting: Surgery

## 2019-09-03 ENCOUNTER — Other Ambulatory Visit: Payer: Self-pay

## 2019-09-03 ENCOUNTER — Encounter: Payer: Self-pay | Admitting: Surgery

## 2019-09-03 VITALS — BP 140/95 | HR 83 | Ht 72.0 in | Wt 251.0 lb

## 2019-09-03 DIAGNOSIS — Z981 Arthrodesis status: Secondary | ICD-10-CM | POA: Diagnosis not present

## 2019-09-03 NOTE — Progress Notes (Signed)
Office Visit Note   Patient: Adam Hernandez           Date of Birth: October 27, 1966           MRN: [id number] Visit Date: 09/03/2019              Requested by: Juline Patch, MD [address] [place],  [place] [address] PCP: Juline Patch, MD   Assessment & Plan: Visit Diagnoses:  1. S/P cervical spinal fusion     Plan: It is okay for patient to discontinue collar.  Follow-up with Dr. Louanne Skye in 4 weeks for recheck.  Still no aggressive activity.  Follow-Up Instructions: Return in about 4 weeks (around 10/01/2019) for with Dr Louanne Skye.   Orders:  Orders Placed This Encounter  Procedures  . XR Cervical Spine 2 or 3 views   No orders of the defined types were placed in this encounter.     Procedures: No procedures performed   Clinical Data: No additional findings.   Subjective: Chief Complaint  Patient presents with  . Neck - Follow-up    06/22/2019 C5-6 ACDF    HPI 53 year old white male who is 2-week status post C5-6 ACDF returns.  Patient missed his 6 week postop appointment because he states that he lost his insurance.  Says that he wore his cervical collar up until 8 weeks postop.  States that his neck is doing well and preop arm pain has resolved.  He says about 3 weeks ago he fell down some stairs and neck had been sore but is doing better. Review of Systems No current cardiac pulmonary GI GU issues  Objective: Vital Signs: BP (!) 140/95   Pulse 83   Ht 6' (1.829 m)   Wt 251 lb (113.9 kg)   BMI 34.04 kg/m   Physical Exam Pleasant male alert and oriented in no acute distress.  He is neurologically intact.  Neck surgical incision is well-healed. Ortho Exam  Specialty Comments:  No specialty comments available.  Imaging: No results found.   PMFS History: Patient Active Problem List   Diagnosis Date Noted  . Herniation of cervical intervertebral disc with radiculopathy 06/22/2019  . Spondylosis without myelopathy or radiculopathy,  cervical region 06/22/2019    Class: Chronic  . Status post cervical spinal fusion 06/22/2019  . Encounter for screening colonoscopy   . Polyp of sigmoid colon   . Synovitis and tenosynovitis 01/06/2019  . Strain of peroneal tendon 12/23/2018  . Closed dislocation of ankle 04/29/2018  . Elevated blood pressure reading 09/02/2017  . Neuropathy of right ankle 09/02/2017  . Obesity (BMI 35.0-39.9 without comorbidity) 09/02/2017  . Injury of nerve of lower extremity 09/02/2017  . Closed bimalleolar fracture 09/26/2016  . Loose body in ankle and foot joint 09/26/2016  . Sprain of deltoid ligament of ankle 09/26/2016   Past Medical History:  Diagnosis Date  . Anxiety   . Arm numbness left    cervical disc issues  . Arthritis    right leg  . GERD (gastroesophageal reflux disease)   . Motion sickness    ocean boats  . Neuromuscular disorder ([place])   . Neuropathy   . Obesity (BMI 35.0-39.9 without comorbidity) 09/02/2017    Family History  Adopted: Yes    Past Surgical History:  Procedure Laterality Date  . ANTERIOR CERVICAL DECOMP/DISCECTOMY FUSION N/A 06/22/2019   Procedure: ANTERIOR CERVICAL DISCECTOMY FUSION C5-6 with plates, screws, allograft bone graft, local bone graft and Vivigen;  Surgeon:  Jessy Oto, MD;  Location: [facility];  Service: Orthopedics;  Laterality: N/A;  . COLONOSCOPY WITH PROPOFOL N/A 04/30/2019   Procedure: COLONOSCOPY WITH BIOPSY;  Surgeon: Lucilla Lame, MD;  Location: [facility];  Service: Endoscopy;  Laterality: N/A;  . LEG SURGERY Right 2017, 2018   fractured leg and ankle- has screws and pins  . POLYPECTOMY N/A 04/30/2019   Procedure: POLYPECTOMY;  Surgeon: Lucilla Lame, MD;  Location: [facility];  Service: Endoscopy;  Laterality: N/A;  . SHOULDER SURGERY Left 2013   labrial tear  . WISDOM TOOTH EXTRACTION     Social History   Occupational History  . Not on file  Tobacco Use  . Smoking status: Never Smoker  . Smokeless tobacco:  Never Used  Substance and Sexual Activity  . Alcohol use: Yes    Alcohol/week: 1.0 standard drinks    Types: 1 Cans of beer per week    Comment: occasional  . Drug use: Never  . Sexual activity: Yes

## 2019-10-02 ENCOUNTER — Ambulatory Visit (INDEPENDENT_AMBULATORY_CARE_PROVIDER_SITE_OTHER): Payer: PRIVATE HEALTH INSURANCE

## 2019-10-02 ENCOUNTER — Other Ambulatory Visit: Payer: Self-pay

## 2019-10-02 ENCOUNTER — Ambulatory Visit (INDEPENDENT_AMBULATORY_CARE_PROVIDER_SITE_OTHER): Payer: PRIVATE HEALTH INSURANCE | Admitting: Specialist

## 2019-10-02 ENCOUNTER — Encounter: Payer: Self-pay | Admitting: Specialist

## 2019-10-02 VITALS — BP 134/96 | HR 91 | Ht 72.0 in | Wt 270.0 lb

## 2019-10-02 DIAGNOSIS — Z981 Arthrodesis status: Secondary | ICD-10-CM

## 2019-10-02 DIAGNOSIS — M47812 Spondylosis without myelopathy or radiculopathy, cervical region: Secondary | ICD-10-CM

## 2019-10-02 NOTE — Progress Notes (Signed)
Post-Op Visit Note   Patient: Adam Hernandez           Date of Birth: [birth year]           MRN: [id number] Visit Date: 10/02/2019 PCP: Juline Patch, MD   Assessment & Plan:14.5 weeks post ACDF C5-6, healing well, mild spondylosis C6-7.   Chief Complaint:  Chief Complaint  Patient presents with  . Neck - Follow-up  Incision is healed Visit Diagnoses:  1. S/P cervical spinal fusion   Incision is healed well left neck. Motor is normal Left lower posterior lateral massess C-spine is tender. Plain radiographs C-Spine healing C5-6 ACDF good position and alignment no movement  With and extension. Mild spondylosis of posterior facets C6-7.  Plan: Avoid overhead lifting and overhead use of the arms. Do not lift greater than 5 lbs. Adjust head rest in vehicle to prevent hyperextension if rear ended. Take extra precautions to avoid falling.   Follow-Up Instructions: No follow-ups on file.   Orders:  Orders Placed This Encounter  Procedures  . XR Cervical Spine 2 or 3 views   No orders of the defined types were placed in this encounter.   Imaging: No results found.  PMFS History: Patient Active Problem List   Diagnosis Date Noted  . Spondylosis without myelopathy or radiculopathy, cervical region 06/22/2019    Priority: Medium    Class: Chronic  . Herniation of cervical intervertebral disc with radiculopathy 06/22/2019  . Status post cervical spinal fusion 06/22/2019  . Encounter for screening colonoscopy   . Polyp of sigmoid colon   . Synovitis and tenosynovitis 01/06/2019  . Strain of peroneal tendon 12/23/2018  . Closed dislocation of ankle 04/29/2018  . Elevated blood pressure reading 09/02/2017  . Neuropathy of right ankle 09/02/2017  . Obesity (BMI 35.0-39.9 without comorbidity) 09/02/2017  . Injury of nerve of lower extremity 09/02/2017  . Closed bimalleolar fracture 09/26/2016  . Loose body in ankle and foot joint 09/26/2016  . Sprain of deltoid  ligament of ankle 09/26/2016   Past Medical History:  Diagnosis Date  . Anxiety   . Arm numbness left    cervical disc issues  . Arthritis    right leg  . GERD (gastroesophageal reflux disease)   . Motion sickness    ocean boats  . Neuromuscular disorder ([place])   . Neuropathy   . Obesity (BMI 35.0-39.9 without comorbidity) 09/02/2017    Family History  Adopted: Yes    Past Surgical History:  Procedure Laterality Date  . ANTERIOR CERVICAL DECOMP/DISCECTOMY FUSION N/A 06/22/2019   Procedure: ANTERIOR CERVICAL DISCECTOMY FUSION C5-6 with plates, screws, allograft bone graft, local bone graft and Vivigen;  Surgeon: Jessy Oto, MD;  Location: [facility];  Service: Orthopedics;  Laterality: N/A;  . COLONOSCOPY WITH PROPOFOL N/A 04/30/2019   Procedure: COLONOSCOPY WITH BIOPSY;  Surgeon: Lucilla Lame, MD;  Location: [facility];  Service: Endoscopy;  Laterality: N/A;  . LEG SURGERY Right 2017, 2018   fractured leg and ankle- has screws and pins  . POLYPECTOMY N/A 04/30/2019   Procedure: POLYPECTOMY;  Surgeon: Lucilla Lame, MD;  Location: [facility];  Service: Endoscopy;  Laterality: N/A;  . SHOULDER SURGERY Left 2013   labrial tear  . WISDOM TOOTH EXTRACTION     Social History   Occupational History  . Not on file  Tobacco Use  . Smoking status: Never Smoker  . Smokeless tobacco: Never Used  Substance and Sexual Activity  . Alcohol use:  Yes    Alcohol/week: 1.0 standard drinks    Types: 1 Cans of beer per week    Comment: occasional  . Drug use: Never  . Sexual activity: Yes

## 2019-10-02 NOTE — Patient Instructions (Signed)
Plan: Avoid overhead lifting and overhead use of the arms. Do not lift greater than 5 lbs. Adjust head rest in vehicle to prevent hyperextension if rear ended. Take extra precautions to avoid falling.

## 2020-02-02 ENCOUNTER — Encounter: Payer: Self-pay | Admitting: Family Medicine

## 2020-02-02 ENCOUNTER — Ambulatory Visit (INDEPENDENT_AMBULATORY_CARE_PROVIDER_SITE_OTHER): Payer: PRIVATE HEALTH INSURANCE | Admitting: Family Medicine

## 2020-02-02 ENCOUNTER — Other Ambulatory Visit: Payer: Self-pay

## 2020-02-02 VITALS — BP 120/90 | HR 84 | Ht 72.0 in | Wt 275.0 lb

## 2020-02-02 DIAGNOSIS — Z0000 Encounter for general adult medical examination without abnormal findings: Secondary | ICD-10-CM | POA: Diagnosis not present

## 2020-02-02 DIAGNOSIS — R030 Elevated blood-pressure reading, without diagnosis of hypertension: Secondary | ICD-10-CM | POA: Diagnosis not present

## 2020-02-02 DIAGNOSIS — Z1211 Encounter for screening for malignant neoplasm of colon: Secondary | ICD-10-CM | POA: Diagnosis not present

## 2020-02-02 LAB — HEMOCCULT GUIAC POC 1CARD (OFFICE): Fecal Occult Blood, POC: NEGATIVE

## 2020-02-02 MED ORDER — LISINOPRIL 10 MG PO TABS: 10.0000 mg | ORAL_TABLET | Freq: Every day | ORAL | 1 refills | Status: DC

## 2020-02-02 NOTE — Progress Notes (Addendum)
Date:  02/02/2020   Name:  Adam Hernandez Signer   DOB:  1966/01/12   MRN:  [id number]   Chief Complaint: Annual Exam (exam for life insurance/ had elevated b/p)  Patient is a 54 year old male who presents for a examination for insurance purposes. The patient reports the following problems: elevated blood pressure. Health maintenance has been reviewed up to date.   Lab Results  Component Value Date   CREATININE 1.53 (H) 06/18/2019   BUN 24 (H) 06/18/2019   NA 136 06/18/2019   K 4.2 06/18/2019   CL 104 06/18/2019   CO2 20 (L) 06/18/2019   Lab Results  Component Value Date   CHOL 261 (H) 03/24/2019   HDL 43 03/24/2019   LDLCALC 164 (H) 03/24/2019   TRIG 270 (H) 03/24/2019   CHOLHDL 6.1 (H) 03/24/2019   No results found for: TSH No results found for: HGBA1C Lab Results  Component Value Date   WBC 8.8 06/18/2019   HGB 15.3 06/18/2019   HCT 47.8 06/18/2019   MCV 92.1 06/18/2019   PLT 306 06/18/2019   Lab Results  Component Value Date   ALT 26 06/18/2019   AST 19 06/18/2019   ALKPHOS 77 06/18/2019   BILITOT 0.6 06/18/2019     Review of Systems  Constitutional: Negative for chills and fever.  HENT: Negative for drooling, ear discharge, ear pain and sore throat.   Respiratory: Negative for cough, shortness of breath and wheezing.   Cardiovascular: Negative for chest pain, palpitations and leg swelling.  Gastrointestinal: Negative for abdominal pain, blood in stool, constipation, diarrhea and nausea.  Endocrine: Negative for polydipsia.  Genitourinary: Negative for dysuria, frequency, hematuria and urgency.  Musculoskeletal: Negative for back pain, myalgias and neck pain.  Skin: Negative for rash.  Allergic/Immunologic: Negative for environmental allergies.  Neurological: Negative for dizziness and headaches.  Hematological: Does not bruise/bleed easily.  Psychiatric/Behavioral: Negative for suicidal ideas. The patient is not nervous/anxious.     Patient Active  Problem List   Diagnosis Date Noted  . Herniation of cervical intervertebral disc with radiculopathy 06/22/2019  . Spondylosis without myelopathy or radiculopathy, cervical region 06/22/2019    Class: Chronic  . Status post cervical spinal fusion 06/22/2019  . Encounter for screening colonoscopy   . Polyp of sigmoid colon   . Synovitis and tenosynovitis 01/06/2019  . Strain of peroneal tendon 12/23/2018  . Closed dislocation of ankle 04/29/2018  . Elevated blood pressure reading 09/02/2017  . Neuropathy of right ankle 09/02/2017  . Obesity (BMI 35.0-39.9 without comorbidity) 09/02/2017  . Injury of nerve of lower extremity 09/02/2017  . Closed bimalleolar fracture 09/26/2016  . Loose body in ankle and foot joint 09/26/2016  . Sprain of deltoid ligament of ankle 09/26/2016    No Known Allergies  Past Surgical History:  Procedure Laterality Date  . ANTERIOR CERVICAL DECOMP/DISCECTOMY FUSION N/A 06/22/2019   Procedure: ANTERIOR CERVICAL DISCECTOMY FUSION C5-6 with plates, screws, allograft bone graft, local bone graft and Vivigen;  Surgeon: Jessy Oto, MD;  Location: [place];  Service: Orthopedics;  Laterality: N/A;  . COLONOSCOPY WITH PROPOFOL N/A 04/30/2019   Procedure: COLONOSCOPY WITH BIOPSY;  Surgeon: Lucilla Lame, MD;  Location: [place];  Service: Endoscopy;  Laterality: N/A;  . LEG SURGERY Right 2017, 2018   fractured leg and ankle- has screws and pins  . POLYPECTOMY N/A 04/30/2019   Procedure: POLYPECTOMY;  Surgeon: Lucilla Lame, MD;  Location: [place];  Service: Endoscopy;  Laterality: N/A;  . SHOULDER SURGERY Left 2013   labrial tear  . WISDOM TOOTH EXTRACTION      Social History   Tobacco Use  . Smoking status: Never Smoker  . Smokeless tobacco: Never Used  Substance Use Topics  . Alcohol use: Yes    Alcohol/week: 1.0 standard drinks    Types: 1 Cans of beer per week    Comment: occasional  . Drug use: Never     Medication list has  been reviewed and updated.  Current Meds  Medication Sig  . celecoxib (CELEBREX) 200 MG capsule Take 200 mg by mouth daily.  Marland Kitchen gabapentin (NEURONTIN) 400 MG capsule Take 400 mg by mouth 4 (four) times daily. As directed by pain management    PHQ 2/9 Scores 02/02/2020 03/24/2019 01/20/2019  PHQ - 2 Score 0 0 0  PHQ- 9 Score 2 0 0    BP Readings from Last 3 Encounters:  02/02/20 120/90  10/02/19 (!) 134/96  09/03/19 (!) 140/95    Physical Exam Vitals and nursing note reviewed.  Constitutional:      Appearance: Normal appearance. He is well-developed and well-groomed.  HENT:     Head: Normocephalic.     Jaw: There is normal jaw occlusion.     Right Ear: Hearing, tympanic membrane, ear canal and external ear normal.     Left Ear: Hearing, tympanic membrane, ear canal and external ear normal.     Nose: Nose normal.     Mouth/Throat:     Lips: Pink.     Mouth: Mucous membranes are moist.     Dentition: Normal dentition.  Eyes:     General: Lids are normal. Vision grossly intact. Gaze aligned appropriately. No visual field deficit or scleral icterus.       Right eye: No discharge.        Left eye: No discharge.     Extraocular Movements: Extraocular movements intact.     Conjunctiva/sclera: Conjunctivae normal.     Pupils: Pupils are equal, round, and reactive to light.  Neck:     Thyroid: No thyroid mass, thyromegaly or thyroid tenderness.     Vascular: No JVD.     Trachea: No tracheal deviation.  Cardiovascular:     Rate and Rhythm: Normal rate and regular rhythm.     Chest Wall: PMI is not displaced.     Pulses: Normal pulses.          Carotid pulses are 2+ on the right side and 2+ on the left side.      Radial pulses are 2+ on the right side and 2+ on the left side.       Femoral pulses are 2+ on the right side and 2+ on the left side.      Popliteal pulses are 2+ on the right side and 2+ on the left side.       Dorsalis pedis pulses are 2+ on the right side and 2+ on  the left side.       Posterior tibial pulses are 2+ on the right side and 2+ on the left side.     Heart sounds: Normal heart sounds, S1 normal and S2 normal. No murmur. No systolic murmur. No diastolic murmur. No friction rub. No gallop. No S3 or S4 sounds.   Pulmonary:     Effort: Pulmonary effort is normal. No respiratory distress.     Breath sounds: Normal breath sounds. No decreased breath sounds, wheezing, rhonchi or rales.  Chest:     Chest wall: No mass.     Breasts:        Right: Normal. No mass.        Left: Normal. No mass.  Abdominal:     General: Bowel sounds are normal.     Palpations: Abdomen is soft. There is no hepatomegaly, splenomegaly or mass.     Tenderness: There is no abdominal tenderness. There is no right CVA tenderness, left CVA tenderness, guarding or rebound.     Hernia: There is no hernia in the left inguinal area or right inguinal area.  Genitourinary:    Penis: Normal.      Testes: Normal.        Right: Mass or tenderness not present.        Left: Mass or tenderness not present.     Epididymis:     Right: Normal.     Left: Normal.     Prostate: Normal. Not enlarged, not tender and no nodules present.     Rectum: Normal. Guaiac result negative. No mass.  Musculoskeletal:        General: No tenderness. Normal range of motion.     Cervical back: Normal, full passive range of motion without pain, normal range of motion and neck supple.     Thoracic back: Normal.     Lumbar back: Normal.  Lymphadenopathy:     Head:     Right side of head: No submandibular adenopathy.     Left side of head: No submandibular adenopathy.     Cervical: No cervical adenopathy.     Right cervical: No superficial, deep or posterior cervical adenopathy.    Left cervical: No superficial, deep or posterior cervical adenopathy.     Upper Body:     Right upper body: No supraclavicular or axillary adenopathy.     Left upper body: No supraclavicular or axillary adenopathy.      Lower Body: No right inguinal adenopathy. No left inguinal adenopathy.  Skin:    General: Skin is warm.     Capillary Refill: Capillary refill takes less than 2 seconds.     Findings: No rash.  Neurological:     General: No focal deficit present.     Mental Status: He is alert and oriented to person, place, and time.     Cranial Nerves: Cranial nerves are intact. No cranial nerve deficit.     Sensory: Sensation is intact.     Motor: Motor function is intact.     Deep Tendon Reflexes: Reflexes are normal and symmetric.  Psychiatric:        Behavior: Behavior is cooperative.     Wt Readings from Last 3 Encounters:  02/02/20 275 lb (124.7 kg)  10/02/19 270 lb (122.5 kg)  09/03/19 251 lb (113.9 kg)    BP 120/90   Pulse 84   Ht 6' (1.829 m)   Wt 275 lb (124.7 kg)   BMI 37.30 kg/m   Assessment and Plan:  1. Annual physical exam No subjective/objective concerns noted during the history and physical exam.  Patient's previous encounters, most recent labs, and most recent imaging were reviewed.Adam Hernandez is a 54 y.o. male who presents today for his Complete Annual Exam. He feels well. He reports exercising walking. He reports he is sleeping well. Immunizations are reviewed and recommendations provided.   Age appropriate screening tests are discussed. Counseling given for risk factor reduction interventions.  Will obtain CBC with differential, lipid panel, and  renal function panel for evaluation of laboratory concerns. - CBC with Differential/Platelet - Lipid Panel With LDL/HDL Ratio - Renal Function Panel  2. Elevated blood pressure reading Over the past several visits including care elsewhere that elevations of blood pressure was noted.  Today's reading is elevated with a diastolic of 90.  We will check a renal function panel to evaluate GFR and initiate lisinopril 10 mg once a day. - Renal Function Panel - lisinopril (ZESTRIL) 10 MG tablet; Take 1 tablet (10 mg total) by mouth  daily.  Dispense: 60 tablet; Refill: 1

## 2020-02-02 NOTE — Addendum Note (Signed)
Addended by: Fredderick Severance on: 02/02/2020 04:24 PM   Modules accepted: Orders

## 2020-02-02 NOTE — Patient Instructions (Signed)
Managing Your Hypertension Hypertension is commonly called high blood pressure. This is when the force of your blood pressing against the walls of your arteries is too strong. Arteries are blood vessels that carry blood from your heart throughout your body. Hypertension forces the heart to work harder to pump blood, and may cause the arteries to become narrow or stiff. Having untreated or uncontrolled hypertension can cause heart attack, stroke, kidney disease, and other problems. What are blood pressure readings? A blood pressure reading consists of a higher number over a lower number. Ideally, your blood pressure should be below 120/80. The first ("top") number is called the systolic pressure. It is a measure of the pressure in your arteries as your heart beats. The second ("bottom") number is called the diastolic pressure. It is a measure of the pressure in your arteries as the heart relaxes. What does my blood pressure reading mean? Blood pressure is classified into four stages. Based on your blood pressure reading, your health care provider may use the following stages to determine what type of treatment you need, if any. Systolic pressure and diastolic pressure are measured in a unit called mm Hg. Normal  Systolic pressure: below 120.  Diastolic pressure: below 80. Elevated  Systolic pressure: 120-129.  Diastolic pressure: below 80. Hypertension stage 1  Systolic pressure: 130-139.  Diastolic pressure: 80-89. Hypertension stage 2  Systolic pressure: 140 or above.  Diastolic pressure: 90 or above. What health risks are associated with hypertension? Managing your hypertension is an important responsibility. Uncontrolled hypertension can lead to:  A heart attack.  A stroke.  A weakened blood vessel (aneurysm).  Heart failure.  Kidney damage.  Eye damage.  Metabolic syndrome.  Memory and concentration problems. What changes can I make to manage my  hypertension? Hypertension can be managed by making lifestyle changes and possibly by taking medicines. Your health care provider will help you make a plan to bring your blood pressure within a normal range. Eating and drinking   Eat a diet that is high in fiber and potassium, and low in salt (sodium), added sugar, and fat. An example eating plan is called the DASH (Dietary Approaches to Stop Hypertension) diet. To eat this way: ? Eat plenty of fresh fruits and vegetables. Try to fill half of your plate at each meal with fruits and vegetables. ? Eat whole grains, such as whole wheat pasta, brown rice, or whole grain bread. Fill about one quarter of your plate with whole grains. ? Eat low-fat diary products. ? Avoid fatty cuts of meat, processed or cured meats, and poultry with skin. Fill about one quarter of your plate with lean proteins such as fish, chicken without skin, beans, eggs, and tofu. ? Avoid premade and processed foods. These tend to be higher in sodium, added sugar, and fat.  Reduce your daily sodium intake. Most people with hypertension should eat less than 1,500 mg of sodium a day.  Limit alcohol intake to no more than 1 drink a day for nonpregnant women and 2 drinks a day for men. One drink equals 12 oz of beer, 5 oz of wine, or 1 oz of hard liquor. Lifestyle  Work with your health care provider to maintain a healthy body weight, or to lose weight. Ask what an ideal weight is for you.  Get at least 30 minutes of exercise that causes your heart to beat faster (aerobic exercise) most days of the week. Activities may include walking, swimming, or biking.  Include exercise   to strengthen your muscles (resistance exercise), such as weight lifting, as part of your weekly exercise routine. Try to do these types of exercises for 30 minutes at least 3 days a week.  Do not use any products that contain nicotine or tobacco, such as cigarettes and e-cigarettes. If you need help quitting,  ask your health care provider.  Control any long-term (chronic) conditions you have, such as high cholesterol or diabetes. Monitoring  Monitor your blood pressure at home as told by your health care provider. Your personal target blood pressure may vary depending on your medical conditions, your age, and other factors.  Have your blood pressure checked regularly, as often as told by your health care provider. Working with your health care provider  Review all the medicines you take with your health care provider because there may be side effects or interactions.  Talk with your health care provider about your diet, exercise habits, and other lifestyle factors that may be contributing to hypertension.  Visit your health care provider regularly. Your health care provider can help you create and adjust your plan for managing hypertension. Will I need medicine to control my blood pressure? Your health care provider may prescribe medicine if lifestyle changes are not enough to get your blood pressure under control, and if:  Your systolic blood pressure is 130 or higher.  Your diastolic blood pressure is 80 or higher. Take medicines only as told by your health care provider. Follow the directions carefully. Blood pressure medicines must be taken as prescribed. The medicine does not work as well when you skip doses. Skipping doses also puts you at risk for problems. Contact a health care provider if:  You think you are having a reaction to medicines you have taken.  You have repeated (recurrent) headaches.  You feel dizzy.  You have swelling in your ankles.  You have trouble with your vision. Get help right away if:  You develop a severe headache or confusion.  You have unusual weakness or numbness, or you feel faint.  You have severe pain in your chest or abdomen.  You vomit repeatedly.  You have trouble breathing. Summary  Hypertension is when the force of blood pumping  through your arteries is too strong. If this condition is not controlled, it may put you at risk for serious complications.  Your personal target blood pressure may vary depending on your medical conditions, your age, and other factors. For most people, a normal blood pressure is less than 120/80.  Hypertension is managed by lifestyle changes, medicines, or both. Lifestyle changes include weight loss, eating a healthy, low-sodium diet, exercising more, and limiting alcohol. This information is not intended to replace advice given to you by your health care provider. Make sure you discuss any questions you have with your health care provider. Document Revised: 02/20/2019 Document Reviewed: 09/26/2016 Elsevier Patient Education  [place]. Hypertension, Adult High blood pressure (hypertension) is when the force of blood pumping through the arteries is too strong. The arteries are the blood vessels that carry blood from the heart throughout the body. Hypertension forces the heart to work harder to pump blood and may cause arteries to become narrow or stiff. Untreated or uncontrolled hypertension can cause a heart attack, heart failure, a stroke, kidney disease, and other problems. A blood pressure reading consists of a higher number over a lower number. Ideally, your blood pressure should be below 120/80. The first ("top") number is called the systolic pressure. It is  a measure of the pressure in your arteries as your heart beats. The second ("bottom") number is called the diastolic pressure. It is a measure of the pressure in your arteries as the heart relaxes. What are the causes? The exact cause of this condition is not known. There are some conditions that result in or are related to high blood pressure. What increases the risk? Some risk factors for high blood pressure are under your control. The following factors may make you more likely to develop this condition:  Smoking.  Having type  2 diabetes mellitus, high cholesterol, or both.  Not getting enough exercise or physical activity.  Being overweight.  Having too much fat, sugar, calories, or salt (sodium) in your diet.  Drinking too much alcohol. Some risk factors for high blood pressure may be difficult or impossible to change. Some of these factors include:  Having chronic kidney disease.  Having a family history of high blood pressure.  Age. Risk increases with age.  Race. You may be at higher risk if you are African American.  Gender. Men are at higher risk than women before age 39. After age 3, women are at higher risk than men.  Having obstructive sleep apnea.  Stress. What are the signs or symptoms? High blood pressure may not cause symptoms. Very high blood pressure (hypertensive crisis) may cause:  Headache.  Anxiety.  Shortness of breath.  Nosebleed.  Nausea and vomiting.  Vision changes.  Severe chest pain.  Seizures. How is this diagnosed? This condition is diagnosed by measuring your blood pressure while you are seated, with your arm resting on a flat surface, your legs uncrossed, and your feet flat on the floor. The cuff of the blood pressure monitor will be placed directly against the skin of your upper arm at the level of your heart. It should be measured at least twice using the same arm. Certain conditions can cause a difference in blood pressure between your right and left arms. Certain factors can cause blood pressure readings to be lower or higher than normal for a short period of time:  When your blood pressure is higher when you are in a health care provider's office than when you are at home, this is called white coat hypertension. Most people with this condition do not need medicines.  When your blood pressure is higher at home than when you are in a health care provider's office, this is called masked hypertension. Most people with this condition may need medicines to  control blood pressure. If you have a high blood pressure reading during one visit or you have normal blood pressure with other risk factors, you may be asked to:  Return on a different day to have your blood pressure checked again.  Monitor your blood pressure at home for 1 week or longer. If you are diagnosed with hypertension, you may have other blood or imaging tests to help your health care provider understand your overall risk for other conditions. How is this treated? This condition is treated by making healthy lifestyle changes, such as eating healthy foods, exercising more, and reducing your alcohol intake. Your health care provider may prescribe medicine if lifestyle changes are not enough to get your blood pressure under control, and if:  Your systolic blood pressure is above 130.  Your diastolic blood pressure is above 80. Your personal target blood pressure may vary depending on your medical conditions, your age, and other factors. Follow these instructions at home: Eating and  drinking   Eat a diet that is high in fiber and potassium, and low in sodium, added sugar, and fat. An example eating plan is called the DASH (Dietary Approaches to Stop Hypertension) diet. To eat this way: ? Eat plenty of fresh fruits and vegetables. Try to fill one half of your plate at each meal with fruits and vegetables. ? Eat whole grains, such as whole-wheat pasta, brown rice, or whole-grain bread. Fill about one fourth of your plate with whole grains. ? Eat or drink low-fat dairy products, such as skim milk or low-fat yogurt. ? Avoid fatty cuts of meat, processed or cured meats, and poultry with skin. Fill about one fourth of your plate with lean proteins, such as fish, chicken without skin, beans, eggs, or tofu. ? Avoid pre-made and processed foods. These tend to be higher in sodium, added sugar, and fat.  Reduce your daily sodium intake. Most people with hypertension should eat less than 1,500 mg  of sodium a day.  Do not drink alcohol if: ? Your health care provider tells you not to drink. ? You are pregnant, may be pregnant, or are planning to become pregnant.  If you drink alcohol: ? Limit how much you use to:  0-1 drink a day for women.  0-2 drinks a day for men. ? Be aware of how much alcohol is in your drink. In the [place] one drink equals one 12 oz bottle of beer (355 mL), one 5 oz glass of wine (148 mL), or one 1 oz glass of hard liquor (44 mL). Lifestyle   Work with your health care provider to maintain a healthy body weight or to lose weight. Ask what an ideal weight is for you.  Get at least 30 minutes of exercise most days of the week. Activities may include walking, swimming, or biking.  Include exercise to strengthen your muscles (resistance exercise), such as Pilates or lifting weights, as part of your weekly exercise routine. Try to do these types of exercises for 30 minutes at least 3 days a week.  Do not use any products that contain nicotine or tobacco, such as cigarettes, e-cigarettes, and chewing tobacco. If you need help quitting, ask your health care provider.  Monitor your blood pressure at home as told by your health care provider.  Keep all follow-up visits as told by your health care provider. This is important. Medicines  Take over-the-counter and prescription medicines only as told by your health care provider. Follow directions carefully. Blood pressure medicines must be taken as prescribed.  Do not skip doses of blood pressure medicine. Doing this puts you at risk for problems and can make the medicine less effective.  Ask your health care provider about side effects or reactions to medicines that you should watch for. Contact a health care provider if you:  Think you are having a reaction to a medicine you are taking.  Have headaches that keep coming back (recurring).  Feel dizzy.  Have swelling in your ankles.  Have trouble with your  vision. Get help right away if you:  Develop a severe headache or confusion.  Have unusual weakness or numbness.  Feel faint.  Have severe pain in your chest or abdomen.  Vomit repeatedly.  Have trouble breathing. Summary  Hypertension is when the force of blood pumping through your arteries is too strong. If this condition is not controlled, it may put you at risk for serious complications.  Your personal target blood pressure may vary  depending on your medical conditions, your age, and other factors. For most people, a normal blood pressure is less than 120/80.  Hypertension is treated with lifestyle changes, medicines, or a combination of both. Lifestyle changes include losing weight, eating a healthy, low-sodium diet, exercising more, and limiting alcohol. This information is not intended to replace advice given to you by your health care provider. Make sure you discuss any questions you have with your health care provider. Document Revised: 07/09/2018 Document Reviewed: 07/09/2018 Elsevier Patient Education  2020 [employer].

## 2020-02-03 LAB — CBC WITH DIFFERENTIAL/PLATELET
Basophils Absolute: 0.1 10*3/uL (ref 0.0–0.2)
Basos: 1 %
EOS (ABSOLUTE): 0.3 10*3/uL (ref 0.0–0.4)
Eos: 4 %
Hematocrit: 44.4 % (ref 37.5–51.0)
Hemoglobin: 14.8 g/dL (ref 13.0–17.7)
Immature Grans (Abs): 0 10*3/uL (ref 0.0–0.1)
Immature Granulocytes: 0 %
Lymphocytes Absolute: 2 10*3/uL (ref 0.7–3.1)
Lymphs: 29 %
MCH: 29.2 pg (ref 26.6–33.0)
MCHC: 33.3 g/dL (ref 31.5–35.7)
MCV: 88 fL (ref 79–97)
Monocytes Absolute: 0.5 10*3/uL (ref 0.1–0.9)
Monocytes: 8 %
Neutrophils Absolute: 4.1 10*3/uL (ref 1.4–7.0)
Neutrophils: 58 %
Platelets: 283 10*3/uL (ref 150–450)
RBC: 5.07 x10E6/uL (ref 4.14–5.80)
RDW: 13.4 % (ref 11.6–15.4)
WBC: 7 10*3/uL (ref 3.4–10.8)

## 2020-02-03 LAB — RENAL FUNCTION PANEL
Albumin: 4.1 g/dL (ref 3.8–4.9)
BUN/Creatinine Ratio: 13 (ref 9–20)
BUN: 18 mg/dL (ref 6–24)
CO2: 24 mmol/L (ref 20–29)
Calcium: 9.6 mg/dL (ref 8.7–10.2)
Chloride: 101 mmol/L (ref 96–106)
Creatinine, Ser: 1.35 mg/dL — ABNORMAL HIGH (ref 0.76–1.27)
GFR calc Af Amer: 69 mL/min/{1.73_m2} (ref >59)
GFR calc non Af Amer: 60 mL/min/{1.73_m2} (ref >59)
Glucose: 104 mg/dL — ABNORMAL HIGH (ref 65–99)
Phosphorus: 3.5 mg/dL (ref 2.8–4.1)
Potassium: 4.5 mmol/L (ref 3.5–5.2)
Sodium: 139 mmol/L (ref 134–144)

## 2020-02-03 LAB — LIPID PANEL WITH LDL/HDL RATIO
Cholesterol, Total: 235 mg/dL — ABNORMAL HIGH (ref 100–199)
HDL: 40 mg/dL (ref >39)
LDL Chol Calc (NIH): 156 mg/dL — ABNORMAL HIGH (ref 0–99)
LDL/HDL Ratio: 3.9 ratio — ABNORMAL HIGH (ref 0.0–3.6)
Triglycerides: 212 mg/dL — ABNORMAL HIGH (ref 0–149)
VLDL Cholesterol Cal: 39 mg/dL (ref 5–40)

## 2020-03-01 ENCOUNTER — Ambulatory Visit: Payer: PRIVATE HEALTH INSURANCE | Admitting: Family Medicine

## 2020-03-01 ENCOUNTER — Ambulatory Visit (INDEPENDENT_AMBULATORY_CARE_PROVIDER_SITE_OTHER): Payer: PRIVATE HEALTH INSURANCE | Admitting: Family Medicine

## 2020-03-01 ENCOUNTER — Other Ambulatory Visit: Payer: Self-pay

## 2020-03-01 ENCOUNTER — Encounter: Payer: Self-pay | Admitting: Family Medicine

## 2020-03-01 DIAGNOSIS — R030 Elevated blood-pressure reading, without diagnosis of hypertension: Secondary | ICD-10-CM | POA: Diagnosis not present

## 2020-03-01 MED ORDER — LISINOPRIL 10 MG PO TABS: 10.0000 mg | ORAL_TABLET | Freq: Every day | ORAL | 1 refills | Status: DC

## 2020-03-01 NOTE — Progress Notes (Signed)
Date:  03/01/2020   Name:  Adam Hernandez   DOB:  Apr 02, 1966   MRN:  [id number]   Chief Complaint: Hypertension  Hypertension This is a chronic problem. The current episode started more than 1 year ago. The problem has been gradually improving since onset. The problem is controlled. Pertinent negatives include no anxiety, blurred vision, chest pain, headaches, malaise/fatigue, neck pain, orthopnea, palpitations, peripheral edema, PND, shortness of breath or sweats. There are no associated agents to hypertension. Risk factors for coronary artery disease include dyslipidemia and obesity. Past treatments include ACE inhibitors. The current treatment provides moderate improvement. There are no compliance problems.  There is no history of angina, kidney disease, CAD/MI, CVA, heart failure, left ventricular hypertrophy, PVD or retinopathy. There is no history of chronic renal disease, a hypertension causing med or renovascular disease.    Lab Results  Component Value Date   CREATININE 1.35 (H) 02/02/2020   BUN 18 02/02/2020   NA 139 02/02/2020   K 4.5 02/02/2020   CL 101 02/02/2020   CO2 24 02/02/2020   Lab Results  Component Value Date   CHOL 235 (H) 02/02/2020   HDL 40 02/02/2020   LDLCALC 156 (H) 02/02/2020   TRIG 212 (H) 02/02/2020   CHOLHDL 6.1 (H) 03/24/2019   No results found for: TSH No results found for: HGBA1C Lab Results  Component Value Date   WBC 7.0 02/02/2020   HGB 14.8 02/02/2020   HCT 44.4 02/02/2020   MCV 88 02/02/2020   PLT 283 02/02/2020   Lab Results  Component Value Date   ALT 26 06/18/2019   AST 19 06/18/2019   ALKPHOS 77 06/18/2019   BILITOT 0.6 06/18/2019     Review of Systems  Constitutional: Negative for chills, fever and malaise/fatigue.  HENT: Negative for drooling, ear discharge, ear pain, postnasal drip, rhinorrhea, sinus pain and sore throat.   Eyes: Negative for blurred vision.  Respiratory: Negative for cough, shortness of breath and  wheezing.   Cardiovascular: Negative for chest pain, palpitations, orthopnea, leg swelling and PND.  Gastrointestinal: Negative for abdominal pain, blood in stool, constipation, diarrhea and nausea.  Endocrine: Negative for polydipsia.  Genitourinary: Negative for dysuria, frequency, hematuria and urgency.  Musculoskeletal: Negative for back pain, myalgias and neck pain.  Skin: Negative for rash.  Allergic/Immunologic: Negative for environmental allergies.  Neurological: Negative for dizziness and headaches.  Hematological: Does not bruise/bleed easily.  Psychiatric/Behavioral: Negative for suicidal ideas. The patient is not nervous/anxious.     Patient Active Problem List   Diagnosis Date Noted  . Herniation of cervical intervertebral disc with radiculopathy 06/22/2019  . Spondylosis without myelopathy or radiculopathy, cervical region 06/22/2019    Class: Chronic  . Status post cervical spinal fusion 06/22/2019  . Encounter for screening colonoscopy   . Polyp of sigmoid colon   . Synovitis and tenosynovitis 01/06/2019  . Strain of peroneal tendon 12/23/2018  . Closed dislocation of ankle 04/29/2018  . Elevated blood pressure reading 09/02/2017  . Neuropathy of right ankle 09/02/2017  . Obesity (BMI 35.0-39.9 without comorbidity) 09/02/2017  . Injury of nerve of lower extremity 09/02/2017  . Closed bimalleolar fracture 09/26/2016  . Loose body in ankle and foot joint 09/26/2016  . Sprain of deltoid ligament of ankle 09/26/2016    No Known Allergies  Past Surgical History:  Procedure Laterality Date  . ANTERIOR CERVICAL DECOMP/DISCECTOMY FUSION N/A 06/22/2019   Procedure: ANTERIOR CERVICAL DISCECTOMY FUSION C5-6 with plates, screws, allograft bone graft,  local bone graft and Vivigen;  Surgeon: Jessy Oto, MD;  Location: [facility];  Service: Orthopedics;  Laterality: N/A;  . COLONOSCOPY WITH PROPOFOL N/A 04/30/2019   Procedure: COLONOSCOPY WITH BIOPSY;  Surgeon: Lucilla Lame, MD;   Location: [facility];  Service: Endoscopy;  Laterality: N/A;  . LEG SURGERY Right 2017, 2018   fractured leg and ankle- has screws and pins  . POLYPECTOMY N/A 04/30/2019   Procedure: POLYPECTOMY;  Surgeon: Lucilla Lame, MD;  Location: [facility];  Service: Endoscopy;  Laterality: N/A;  . SHOULDER SURGERY Left 2013   labrial tear  . WISDOM TOOTH EXTRACTION      Social History   Tobacco Use  . Smoking status: Never Smoker  . Smokeless tobacco: Never Used  Substance Use Topics  . Alcohol use: Yes    Alcohol/week: 1.0 standard drinks    Types: 1 Cans of beer per week    Comment: occasional  . Drug use: Never     Medication list has been reviewed and updated.  Current Meds  Medication Sig  . celecoxib (CELEBREX) 200 MG capsule Take 200 mg by mouth daily.  Marland Kitchen gabapentin (NEURONTIN) 400 MG capsule Take 400 mg by mouth 4 (four) times daily. As directed by pain management  . lisinopril (ZESTRIL) 10 MG tablet Take 1 tablet (10 mg total) by mouth daily.    PHQ 2/9 Scores 03/01/2020 02/02/2020 03/24/2019 01/20/2019  PHQ - 2 Score 0 0 0 0  PHQ- 9 Score 0 2 0 0    BP Readings from Last 3 Encounters:  03/01/20 110/68  02/02/20 120/90  10/02/19 (!) 134/96    Physical Exam Vitals and nursing note reviewed.  HENT:     Head: Normocephalic.     Right Ear: Tympanic membrane, ear canal and external ear normal.     Left Ear: Tympanic membrane, ear canal and external ear normal.     Nose: Nose normal. No congestion or rhinorrhea.     Mouth/Throat:     Mouth: Mucous membranes are moist.  Eyes:     General: No scleral icterus.       Right eye: No discharge.        Left eye: No discharge.     Conjunctiva/sclera: Conjunctivae normal.     Pupils: Pupils are equal, round, and reactive to light.  Neck:     Thyroid: No thyromegaly.     Vascular: No JVD.     Trachea: No tracheal deviation.  Cardiovascular:     Rate and Rhythm: Normal rate and regular rhythm.     Heart  sounds: Normal heart sounds. No murmur. No friction rub. No gallop.   Pulmonary:     Effort: Pulmonary effort is normal. No respiratory distress.     Breath sounds: Normal breath sounds. No wheezing, rhonchi or rales.  Abdominal:     General: Bowel sounds are normal.     Palpations: Abdomen is soft. There is no mass.     Tenderness: There is no abdominal tenderness. There is no right CVA tenderness, left CVA tenderness, guarding or rebound.  Musculoskeletal:        General: No tenderness. Normal range of motion.     Cervical back: Normal range of motion and neck supple.  Lymphadenopathy:     Cervical: No cervical adenopathy.  Skin:    General: Skin is warm.     Findings: No rash.  Neurological:     Mental Status: He is alert and oriented to  person, place, and time.     Cranial Nerves: No cranial nerve deficit.     Motor: No weakness.     Deep Tendon Reflexes: Reflexes are normal and symmetric.     Wt Readings from Last 3 Encounters:  03/01/20 273 lb (123.8 kg)  02/02/20 275 lb (124.7 kg)  10/02/19 270 lb (122.5 kg)    BP 110/68   Pulse 80   Ht 6' (1.829 m)   Wt 273 lb (123.8 kg)   BMI 37.03 kg/m   Assessment and Plan:  1. Elevated blood pressure reading Chronic.  Controlled.  Stable.  Continue lisinopril 10 mg once a day.  We will recheck patient in 6 months at which time we will do renal function panel and lipid panel. - lisinopril (ZESTRIL) 10 MG tablet; Take 1 tablet (10 mg total) by mouth daily.  Dispense: 90 tablet; Refill: 1

## 2020-03-16 IMAGING — MR MRI CERVICAL SPINE WITHOUT CONTRAST
4 of 5 series · 24 of 48 positions shown · non-contrast
Comparison: Cervical radiographs 01/15/2019

CLINICAL DATA: Cervicalgia

EXAM:
MRI CERVICAL SPINE WITHOUT CONTRAST
TECHNIQUE: Multiplanar, multisequence MR imaging of the cervical spine was
performed. No intravenous contrast was administered.

[Series 3: T2 post-contrast · sagittal · 3.3mm · 0.39mm/px · 6 of 13 slices shown]
[im 1/13]
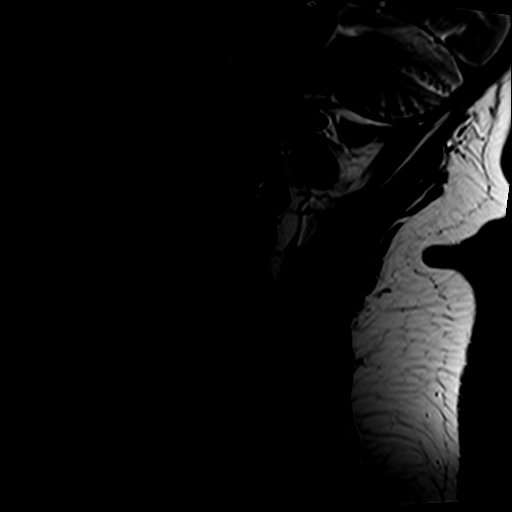
[im 3/13]
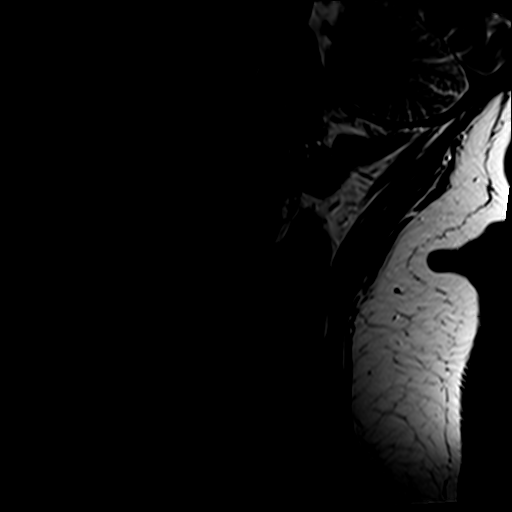
[im 5/13]
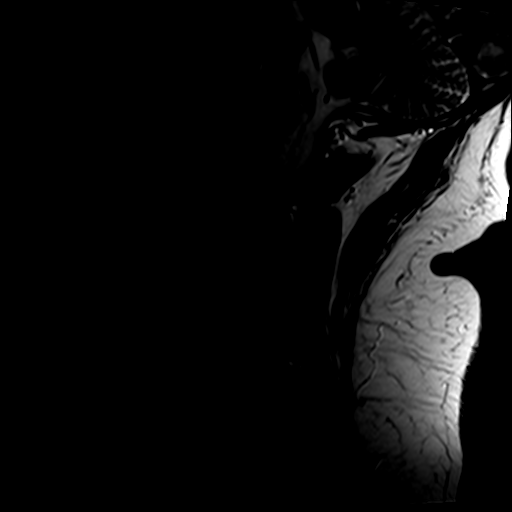
[im 8/13]
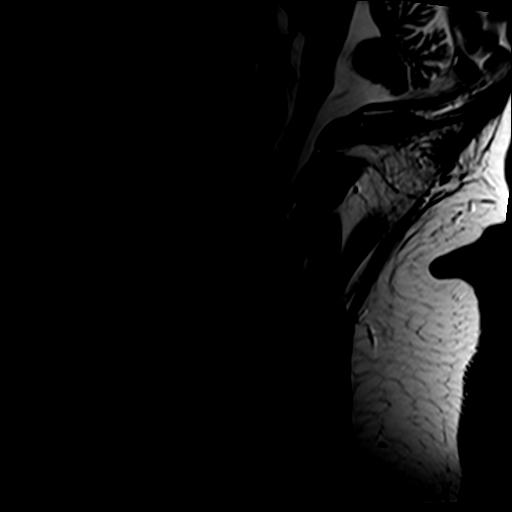
[im 10/13]
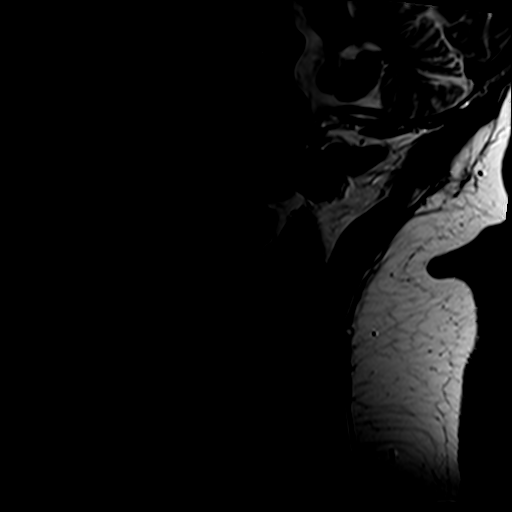
[im 13/13]
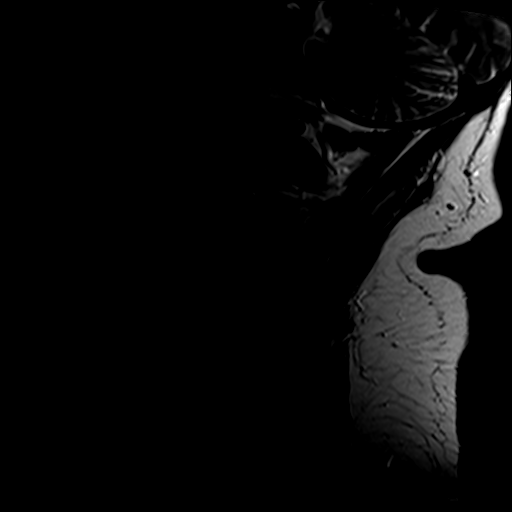

[Series 4: T1 · sagittal · 3.3mm · 0.37mm/px · 7 of 13 slices shown]
[im 1/13]
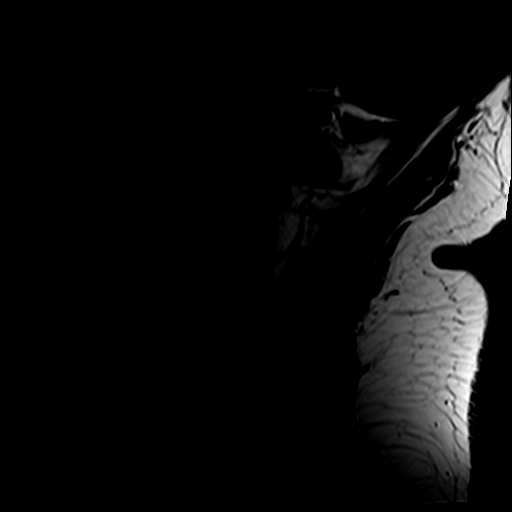
[im 3/13]
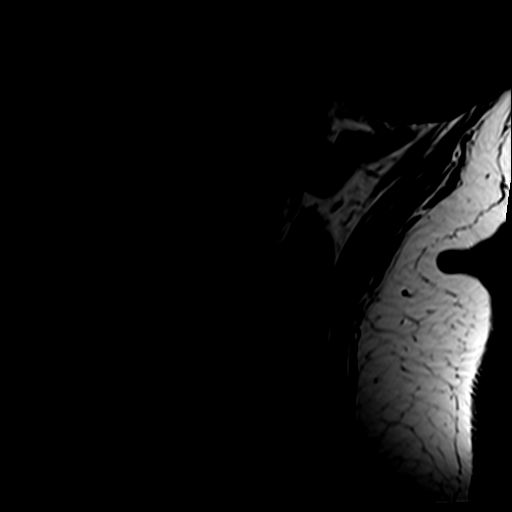
[im 5/13]
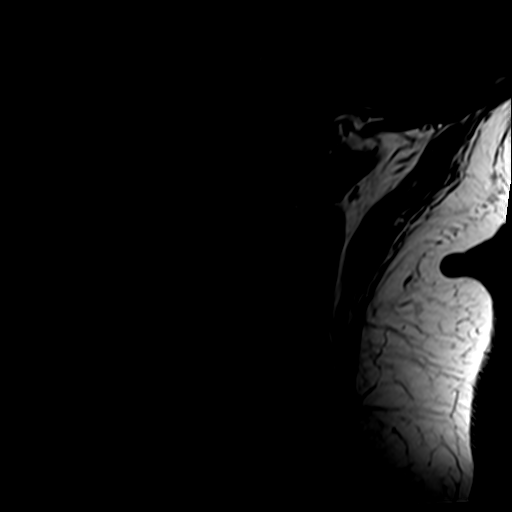
[im 7/13]
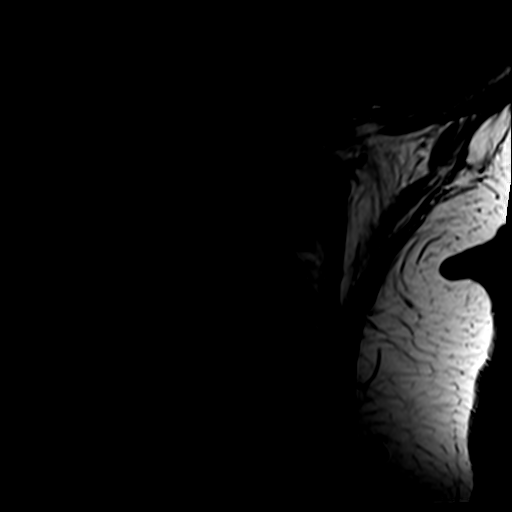
[im 9/13]
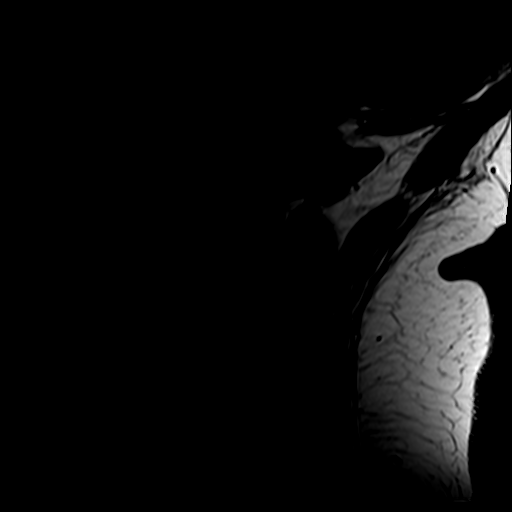
[im 11/13]
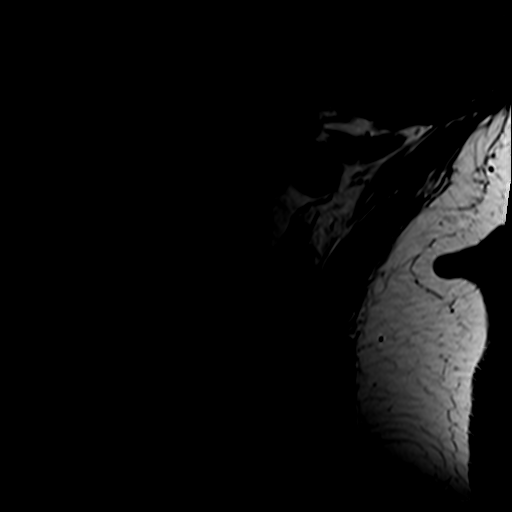
[im 13/13]
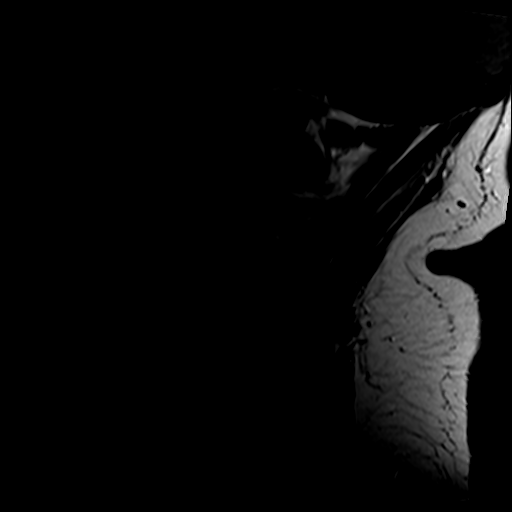

[Series 5: tir sag · sagittal · 3.3mm · 0.37mm/px · 3 of 13 slices shown]
[im 3/13]
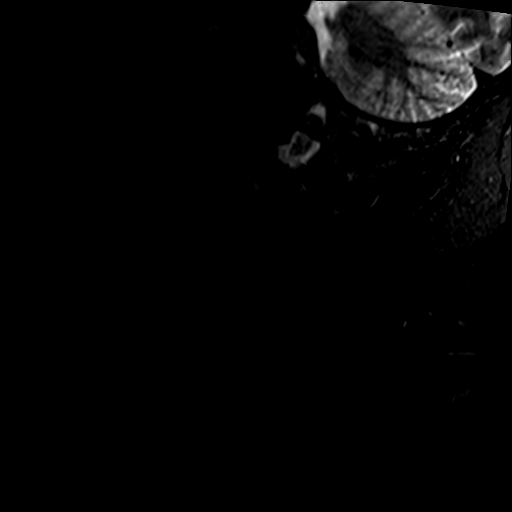
[im 7/13]
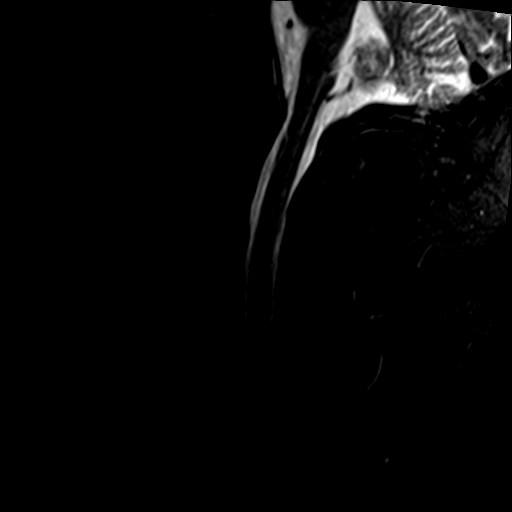
[im 11/13]
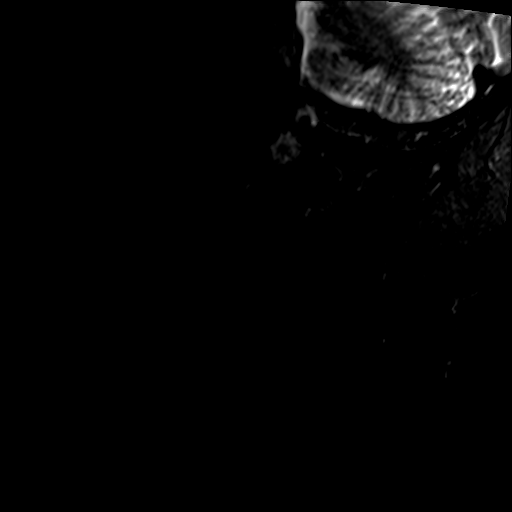

[Series 7: T2 · axial · 3.0mm · 0.70mm/px · z∈[-81,+17]mm · 8 of 28 slices shown]
[im 1/28]
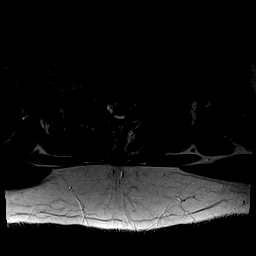
[im 5/28]
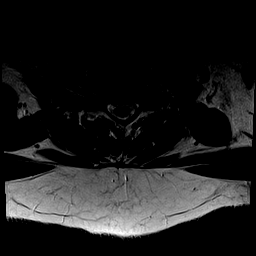
[im 9/28]
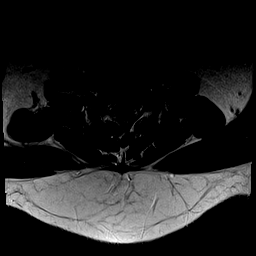
[im 13/28]
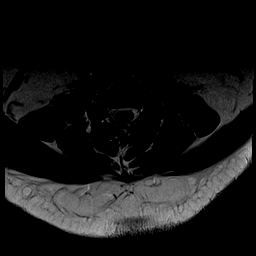
[im 15/28]
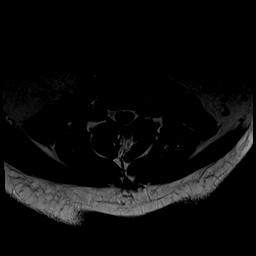
[im 19/28]
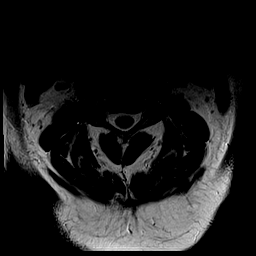
[im 23/28]
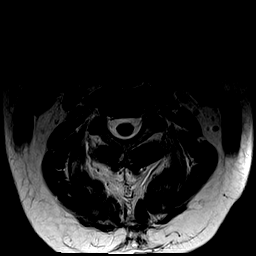
[im 28/28]
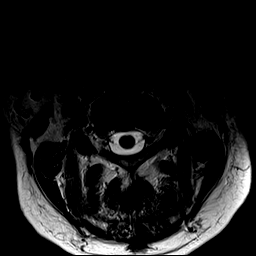

[24 of 48 positions shown; findings below may reference images not displayed]

FINDINGS: Alignment: Normal

Vertebrae: Normal bone marrow.  Negative for fracture or mass

Cord: No cord signal abnormality identified. Cord evaluation limited
by motion

Posterior Fossa, vertebral arteries, paraspinal tissues: Negative

Disc levels:

C2-3: Negative

C3-4: Negative

C4-5: Left foraminal encroachment due to spurring with expected
impingement left C5 nerve root. Spinal canal and right foramen
patent

C5-6: Moderate disc degeneration with diffuse uncinate spurring.
Moderate right foraminal encroachment and severe left foraminal
encroachment. Cord flattening with moderate spinal stenosis.

C6-7: Right paracentral disc protrusion with mild right foraminal
encroachment. Cord flattening and mild spinal stenosis on the right

C7-T1: Bilateral facet degeneration contributing to mild foraminal
narrowing bilaterally.
IMPRESSION: Left foraminal encroachment C4-5

Moderate right foraminal encroachment and severe left foraminal
encroachment C5-6 due to spurring. Moderate spinal stenosis C5-6

Right sided disc protrusion at C6-7 with mild right foraminal
encroachment

Mild foraminal narrowing bilaterally C7-T1.

## 2020-03-28 ENCOUNTER — Encounter: Payer: Self-pay | Admitting: Family Medicine

## 2020-07-12 ENCOUNTER — Encounter: Payer: Self-pay | Admitting: Specialist

## 2020-07-20 ENCOUNTER — Encounter: Payer: Self-pay | Admitting: Specialist

## 2020-07-20 ENCOUNTER — Ambulatory Visit: Payer: Medicare Other | Admitting: Specialist

## 2020-07-20 ENCOUNTER — Other Ambulatory Visit: Payer: Self-pay

## 2020-07-20 ENCOUNTER — Ambulatory Visit: Payer: Self-pay

## 2020-07-20 VITALS — BP 138/82 | HR 67 | Ht 72.0 in | Wt 268.4 lb

## 2020-07-20 DIAGNOSIS — M545 Low back pain, unspecified: Secondary | ICD-10-CM

## 2020-07-20 DIAGNOSIS — M4807 Spinal stenosis, lumbosacral region: Secondary | ICD-10-CM | POA: Diagnosis not present

## 2020-07-20 DIAGNOSIS — M5416 Radiculopathy, lumbar region: Secondary | ICD-10-CM

## 2020-07-20 MED ORDER — ALPRAZOLAM 0.5 MG PO TABS: ORAL_TABLET | ORAL | 0 refills | Status: DC

## 2020-07-20 MED ORDER — METHYLPREDNISOLONE 4 MG PO TBPK: ORAL_TABLET | ORAL | 0 refills | Status: DC

## 2020-07-20 NOTE — Patient Instructions (Signed)
Avoid frequent bending and stooping  No lifting greater than 10 lbs. May use ice or moist heat for pain. Weight loss is of benefit. Hold celebrex while taking medrol as they may combined cause ulcer or stomach upset. MRI of the lumbar spine ordered.

## 2020-07-20 NOTE — Progress Notes (Addendum)
Office Visit Note   Patient: Adam Hernandez           Date of Birth: October 24, 1966           MRN: [id number] Visit Date: 07/20/2020              Requested by: Juline Patch, MD [address] [place],  [place] [address] PCP: Juline Patch, MD   Assessment & Plan: Visit Diagnoses:  1. Low back pain, unspecified back pain laterality, unspecified chronicity, unspecified whether sciatica present   2. Radiculopathy, lumbar region   3. Spinal stenosis of lumbosacral region   54 year old male with history of right leg and foot peripheral nerve entrapment due to previous traumatic injury He has had a acute onset of back pain with radiation into the right leg starting late July. Pain pattern suggests Stenosis with claudication but plain radiographs show only mild degenerative disc changes. This may be a Disc syndrome with subarticular narrowing on the right side at L4-5 that would affect the L5 root or a lateral disc  Protrusion at L5-S1 causing foramenal entrapment of the right L5 root with similar claudication pattern. MRI  Will help delineate the cause and may assist in conservative steroid injection placement. He has notice a worsening Of the right foot pain and the right leg surgery has been recommended but this may be partly due to the new lumbar condition. Steroid dose pak, continue with gabapentin he is already taking for right LE neuropathy,xanax to help with Anxiety in MRI.   Plan: Avoid frequent bending and stooping  No lifting greater than 10 lbs. May use ice or moist heat for pain. Weight loss is of benefit. Hold celebrex while taking medrol as they may combined cause ulcer or stomach upset. MRI of the lumbar spine ordered.    Follow-Up Instructions: Return in about 3 weeks (around 08/10/2020).   Orders:  Orders Placed This Encounter  Procedures  . XR Lumbar Spine 2-3 Views  . MR Lumbar Spine w/o contrast   Meds ordered this encounter  Medications  .  methylPREDNISolone (MEDROL DOSEPAK) 4 MG TBPK tablet    Sig: 6 day medrol dose pak, take as directed.    Dispense:  21 tablet    Refill:  0  . ALPRAZolam (XANAX) 0.5 MG tablet    Sig: Take one at the time of the MRI after signing paperwork and may repeat in 30 min.    Dispense:  2 tablet    Refill:  0      Procedures: No procedures performed   Clinical Data: No additional findings.   Subjective: Chief Complaint  Patient presents with  . Lower Back - Pain    54 year old male with 8 week history of lower right back pain with radiation into the right upper medial buttock and down the right leg. History of previous right foot and lower leg injury with multiple bone fracture into the right foot and ankle. He is being seen by Foot and Ankle specialist at [facility] and is scheduled to eventually undergo a right peroneal neurolysis due to scar entraping the right peroneal nerve above the right ankle. He reports no injury but onset of right lower back pain that progressed to radiation into the right buttock, lateral thigh and right lateral calf and foot. He indicates that his Activity level is terrible with inability to walk any distance due to severe back and right leg pain. No bowel  or bladder dysfunction. Pain is present with sitting and bending and stooping and riding in a car. He is squirming when seated and tends to lean to the left side to relieve the right leg pain. Night pain and inability to sleep due to right leg sciatica nerve pain. He is leaning on carts when grocery shopping. He underwent previous steroid injection by his primary care physician with no Improvement. Physical therapy was ordered and he has not yet started therapy.    Review of Systems  Constitutional: Negative.   HENT: Negative.   Eyes: Negative.   Respiratory: Negative.   Cardiovascular: Negative.  Negative for chest pain, palpitations and leg swelling.  Gastrointestinal: Negative.   Genitourinary:  Negative.   Musculoskeletal: Positive for back pain and gait problem.  Skin: Negative.   Allergic/Immunologic: Negative.   Neurological: Positive for weakness and numbness.  Hematological: Negative.   Psychiatric/Behavioral: Negative.      Objective: Vital Signs: BP 138/82 (BP Location: Left Arm, Patient Position: Sitting)   Pulse 67   Ht 6' (1.829 m)   Wt 268 lb 6.4 oz (121.7 kg)   BMI 36.40 kg/m   Physical Exam Musculoskeletal:     Lumbar back: Positive right straight leg raise test. Negative left straight leg raise test.     Back Exam   Tenderness  The patient is experiencing tenderness in the lumbar.  Range of Motion  Extension: abnormal  Flexion: abnormal  Lateral bend right:  30 abnormal  Lateral bend left:  70 abnormal  Rotation right:  50 abnormal  Rotation left:  50 abnormal   Muscle Strength  Right Quadriceps:  5/5  Left Quadriceps:  5/5  Right Hamstrings:  5/5  Left Hamstrings:  5/5   Tests  Straight leg raise right: positive at 60 deg Straight leg raise left: negative  Reflexes  Patellar: 2/4 Achilles: 0/4  Other  Toe walk: abnormal Heel walk: abnormal Sensation: decreased Gait: drop-foot   Comments:  Weakness right EHL is 2/5 right foot DF 4+/5 Knee extension 5/5 Popliteal compression sign is positive right knee. Positive bowstring sign right leg       Specialty Comments:  No specialty comments available.  Imaging: No results found.   PMFS History: Patient Active Problem List   Diagnosis Date Noted  . Spondylosis without myelopathy or radiculopathy, cervical region 06/22/2019    Priority: Medium    Class: Chronic  . Herniation of cervical intervertebral disc with radiculopathy 06/22/2019  . Status post cervical spinal fusion 06/22/2019  . Encounter for screening colonoscopy   . Polyp of sigmoid colon   . Synovitis and tenosynovitis 01/06/2019  . Strain of peroneal tendon 12/23/2018  . Closed dislocation of ankle  04/29/2018  . Elevated blood pressure reading 09/02/2017  . Neuropathy of right ankle 09/02/2017  . Obesity (BMI 35.0-39.9 without comorbidity) 09/02/2017  . Injury of nerve of lower extremity 09/02/2017  . Closed bimalleolar fracture 09/26/2016  . Loose body in ankle and foot joint 09/26/2016  . Sprain of deltoid ligament of ankle 09/26/2016   Past Medical History:  Diagnosis Date  . Anxiety   . Arm numbness left    cervical disc issues  . Arthritis    right leg  . GERD (gastroesophageal reflux disease)   . Motion sickness    ocean boats  . Neuromuscular disorder ([place])   . Neuropathy   . Obesity (BMI 35.0-39.9 without comorbidity) 09/02/2017    Family History  Adopted: Yes    Past Surgical  History:  Procedure Laterality Date  . ANTERIOR CERVICAL DECOMP/DISCECTOMY FUSION N/A 06/22/2019   Procedure: ANTERIOR CERVICAL DISCECTOMY FUSION C5-6 with plates, screws, allograft bone graft, local bone graft and Vivigen;  Surgeon: Jessy Oto, MD;  Location: [place];  Service: Orthopedics;  Laterality: N/A;  . COLONOSCOPY WITH PROPOFOL N/A 04/30/2019   Procedure: COLONOSCOPY WITH BIOPSY;  Surgeon: Lucilla Lame, MD;  Location: [place];  Service: Endoscopy;  Laterality: N/A;  . LEG SURGERY Right 2017, 2018   fractured leg and ankle- has screws and pins  . POLYPECTOMY N/A 04/30/2019   Procedure: POLYPECTOMY;  Surgeon: Lucilla Lame, MD;  Location: [place];  Service: Endoscopy;  Laterality: N/A;  . SHOULDER SURGERY Left 2013   labrial tear  . WISDOM TOOTH EXTRACTION     Social History   Occupational History  . Not on file  Tobacco Use  . Smoking status: Never Smoker  . Smokeless tobacco: Never Used  Vaping Use  . Vaping Use: Never used  Substance and Sexual Activity  . Alcohol use: Yes    Alcohol/week: 1.0 standard drink    Types: 1 Cans of beer per week    Comment: occasional  . Drug use: Never  . Sexual activity: Yes

## 2020-07-21 ENCOUNTER — Ambulatory Visit: Payer: PRIVATE HEALTH INSURANCE | Admitting: Surgery

## 2020-07-21 ENCOUNTER — Telehealth: Payer: Self-pay

## 2020-07-21 NOTE — Telephone Encounter (Signed)
Patient called he would like MRI to be scheduled to closer office near  his location which is [place] due to transportation he requested [facility] if possible call back:[phone].

## 2020-07-21 NOTE — Telephone Encounter (Signed)
Can you change the location? He lives toward [facility] and would like to go somewhere that way.  And he would need to bring a CD of the images with him.

## 2020-08-18 ENCOUNTER — Ambulatory Visit: Payer: Medicare Other | Admitting: Specialist

## 2020-08-18 ENCOUNTER — Encounter: Payer: Self-pay | Admitting: Specialist

## 2020-08-18 ENCOUNTER — Telehealth: Payer: Self-pay

## 2020-08-18 ENCOUNTER — Other Ambulatory Visit: Payer: Self-pay

## 2020-08-18 VITALS — BP 129/84 | HR 82 | Ht 72.0 in | Wt 268.4 lb

## 2020-08-18 DIAGNOSIS — M5416 Radiculopathy, lumbar region: Secondary | ICD-10-CM

## 2020-08-18 DIAGNOSIS — M4807 Spinal stenosis, lumbosacral region: Secondary | ICD-10-CM | POA: Diagnosis not present

## 2020-08-18 DIAGNOSIS — Z981 Arthrodesis status: Secondary | ICD-10-CM

## 2020-08-18 DIAGNOSIS — G5731 Lesion of lateral popliteal nerve, right lower limb: Secondary | ICD-10-CM

## 2020-08-18 NOTE — Patient Instructions (Signed)
Plan: Avoid bending, stooping and avoid lifting weights greater than 10 lbs. Avoid prolong standing and walking. Avoid frequent bending and stooping  No lifting greater than 10 lbs. May use ice or moist heat for pain. Weight loss is of benefit. Handicap license is approved. Dr. Newton's secretary/Assistant will call to arrange for epidural steroid injection  

## 2020-08-18 NOTE — Telephone Encounter (Signed)
Patient called in saying he was confused on who is doing  His injection , says he was informed [facility] was doing it but he has a random appt with Benjiman Core.. patient just wants to know who is doing his injections and if he needs to cancel appt with Benjiman Core.

## 2020-08-18 NOTE — Telephone Encounter (Signed)
I called and lmom that the appt with Jeneen Rinks looks like it was made in error, I did cancel that appt and advised that Dr. Romona Curls staff will be calling him to schedule his injection

## 2020-08-18 NOTE — Progress Notes (Signed)
Office Visit Note   Patient: Adam Hernandez           Date of Birth: Oct 13, 1966           MRN: [id number] Visit Date: 08/18/2020              Requested by: Juline Patch, MD [address] [place],  [place] [address] PCP: Juline Patch, MD   Assessment & Plan: Visit Diagnoses:  1. Radiculopathy, lumbar region   2. Spinal stenosis of lumbosacral region   3. S/P cervical spinal fusion   4. Entrapment neuropathy of right superficial peroneal nerve     Plan: Avoid bending, stooping and avoid lifting weights greater than 10 lbs. Avoid prolong standing and walking. Avoid frequent bending and stooping  No lifting greater than 10 lbs. May use ice or moist heat for pain. Weight loss is of benefit. Handicap license is approved. Dr. Romona Curls secretary/Assistant will call to arrange for epidural steroid injection   Follow-Up Instructions: No follow-ups on file.   Orders:  No orders of the defined types were placed in this encounter.  No orders of the defined types were placed in this encounter.     Procedures: No procedures performed   Clinical Data: No additional findings.   Subjective: Chief Complaint  Patient presents with  . Lower Back - Follow-up    MRI Review    54 year old male with history of Right ankle trimalleolar fracture dislocation and previous lumbar disc herniation. He is experiencing pain with standing and walking. Pain radiating into the right lateral thigh and left as well. He is to undergo right distal leg neurolysis of the peroneal nerve, likely the superficial peroneal n. Due to entrapment from the previous fracture and ORIF. Worsening pain with claudication pattern  Suspicious for stenosis. MRI was done and he has the disc from [facility] [place]. No bowel or bladder dysfunction.    Review of Systems   Objective: Vital Signs: BP 129/84 (BP Location: Left Arm, Patient Position: Sitting)   Pulse 82   Ht 6' (1.829 m)   Wt  268 lb 6.4 oz (121.7 kg)   BMI 36.40 kg/m   Physical Exam  Ortho Exam  Specialty Comments:  No specialty comments available.  Imaging: No results found.   PMFS History: Patient Active Problem List   Diagnosis Date Noted  . Spondylosis without myelopathy or radiculopathy, cervical region 06/22/2019    Priority: Medium    Class: Chronic  . Herniation of cervical intervertebral disc with radiculopathy 06/22/2019  . Status post cervical spinal fusion 06/22/2019  . Encounter for screening colonoscopy   . Polyp of sigmoid colon   . Synovitis and tenosynovitis 01/06/2019  . Strain of peroneal tendon 12/23/2018  . Closed dislocation of ankle 04/29/2018  . Elevated blood pressure reading 09/02/2017  . Neuropathy of right ankle 09/02/2017  . Obesity (BMI 35.0-39.9 without comorbidity) 09/02/2017  . Injury of nerve of lower extremity 09/02/2017  . Closed bimalleolar fracture 09/26/2016  . Loose body in ankle and foot joint 09/26/2016  . Sprain of deltoid ligament of ankle 09/26/2016   Past Medical History:  Diagnosis Date  . Anxiety   . Arm numbness left    cervical disc issues  . Arthritis    right leg  . GERD (gastroesophageal reflux disease)   . Motion sickness    ocean boats  . Neuromuscular disorder ([place])   . Neuropathy   . Obesity (BMI  35.0-39.9 without comorbidity) 09/02/2017    Family History  Adopted: Yes    Past Surgical History:  Procedure Laterality Date  . ANTERIOR CERVICAL DECOMP/DISCECTOMY FUSION N/A 06/22/2019   Procedure: ANTERIOR CERVICAL DISCECTOMY FUSION C5-6 with plates, screws, allograft bone graft, local bone graft and Vivigen;  Surgeon: Jessy Oto, MD;  Location: [place];  Service: Orthopedics;  Laterality: N/A;  . COLONOSCOPY WITH PROPOFOL N/A 04/30/2019   Procedure: COLONOSCOPY WITH BIOPSY;  Surgeon: Lucilla Lame, MD;  Location: [facility];  Service: Endoscopy;  Laterality: N/A;  . LEG SURGERY Right 2017, 2018   fractured leg and  ankle- has screws and pins  . POLYPECTOMY N/A 04/30/2019   Procedure: POLYPECTOMY;  Surgeon: Lucilla Lame, MD;  Location: [facility];  Service: Endoscopy;  Laterality: N/A;  . SHOULDER SURGERY Left 2013   labrial tear  . WISDOM TOOTH EXTRACTION     Social History   Occupational History  . Not on file  Tobacco Use  . Smoking status: Never Smoker  . Smokeless tobacco: Never Used  Vaping Use  . Vaping Use: Never used  Substance and Sexual Activity  . Alcohol use: Yes    Alcohol/week: 1.0 standard drink    Types: 1 Cans of beer per week    Comment: occasional  . Drug use: Never  . Sexual activity: Yes

## 2020-08-19 ENCOUNTER — Encounter: Payer: Self-pay | Admitting: Family Medicine

## 2020-08-23 ENCOUNTER — Encounter: Payer: Self-pay | Admitting: Family Medicine

## 2020-08-23 ENCOUNTER — Other Ambulatory Visit: Payer: Self-pay

## 2020-08-23 ENCOUNTER — Ambulatory Visit (INDEPENDENT_AMBULATORY_CARE_PROVIDER_SITE_OTHER): Payer: Medicare Other | Admitting: Family Medicine

## 2020-08-23 VITALS — BP 120/80 | HR 84 | Ht 72.0 in | Wt 268.0 lb

## 2020-08-23 DIAGNOSIS — Z23 Encounter for immunization: Secondary | ICD-10-CM | POA: Diagnosis not present

## 2020-08-23 DIAGNOSIS — E782 Mixed hyperlipidemia: Secondary | ICD-10-CM

## 2020-08-23 DIAGNOSIS — R030 Elevated blood-pressure reading, without diagnosis of hypertension: Secondary | ICD-10-CM

## 2020-08-23 MED ORDER — LISINOPRIL 10 MG PO TABS: 10.0000 mg | ORAL_TABLET | Freq: Every day | ORAL | 1 refills | Status: DC

## 2020-08-23 NOTE — Progress Notes (Signed)
Date:  08/23/2020   Name:  Adam Hernandez   DOB:  01-07-1966   MRN:  [id number]   Chief Complaint: Hypertension (seems to be doing well on lisinopril) and Flu Vaccine  Hypertension This is a chronic problem. The current episode started more than 1 year ago. The problem has been gradually improving since onset. The problem is controlled. Pertinent negatives include no anxiety, blurred vision, chest pain, headaches, malaise/fatigue, neck pain, orthopnea, palpitations, peripheral edema, PND, shortness of breath or sweats. There are no associated agents to hypertension. There are no known risk factors for coronary artery disease. Past treatments include ACE inhibitors. The current treatment provides moderate improvement. There are no compliance problems.  There is no history of angina, kidney disease, CAD/MI, CVA, heart failure, left ventricular hypertrophy, PVD or retinopathy. There is no history of chronic renal disease, a hypertension causing med or renovascular disease.  Hyperlipidemia This is a chronic problem. The current episode started more than 1 year ago. The problem is controlled. Recent lipid tests were reviewed and are normal. He has no history of chronic renal disease, diabetes, hypothyroidism, liver disease, obesity or nephrotic syndrome. Factors aggravating his hyperlipidemia include thiazides. Pertinent negatives include no chest pain, focal sensory loss, focal weakness, leg pain, myalgias or shortness of breath. Current antihyperlipidemic treatment includes diet change. Risk factors for coronary artery disease include dyslipidemia.    Lab Results  Component Value Date   CREATININE 1.35 (H) 02/02/2020   BUN 18 02/02/2020   NA 139 02/02/2020   K 4.5 02/02/2020   CL 101 02/02/2020   CO2 24 02/02/2020   Lab Results  Component Value Date   CHOL 235 (H) 02/02/2020   HDL 40 02/02/2020   LDLCALC 156 (H) 02/02/2020   TRIG 212 (H) 02/02/2020   CHOLHDL 6.1 (H) 03/24/2019   No  results found for: TSH No results found for: HGBA1C Lab Results  Component Value Date   WBC 7.0 02/02/2020   HGB 14.8 02/02/2020   HCT 44.4 02/02/2020   MCV 88 02/02/2020   PLT 283 02/02/2020   Lab Results  Component Value Date   ALT 26 06/18/2019   AST 19 06/18/2019   ALKPHOS 77 06/18/2019   BILITOT 0.6 06/18/2019     Review of Systems  Constitutional: Negative for chills, fever and malaise/fatigue.  HENT: Negative for drooling, ear discharge, ear pain and sore throat.   Eyes: Negative for blurred vision.  Respiratory: Negative for cough, shortness of breath and wheezing.   Cardiovascular: Negative for chest pain, palpitations, orthopnea, leg swelling and PND.  Gastrointestinal: Negative for abdominal pain, blood in stool, constipation, diarrhea and nausea.  Endocrine: Negative for polydipsia.  Genitourinary: Negative for dysuria, frequency, hematuria and urgency.  Musculoskeletal: Negative for back pain, myalgias and neck pain.  Skin: Negative for rash.  Allergic/Immunologic: Negative for environmental allergies.  Neurological: Negative for dizziness, focal weakness and headaches.  Hematological: Does not bruise/bleed easily.  Psychiatric/Behavioral: Negative for suicidal ideas. The patient is not nervous/anxious.     Patient Active Problem List   Diagnosis Date Noted  . Herniation of cervical intervertebral disc with radiculopathy 06/22/2019  . Spondylosis without myelopathy or radiculopathy, cervical region 06/22/2019    Class: Chronic  . Status post cervical spinal fusion 06/22/2019  . Encounter for screening colonoscopy   . Polyp of sigmoid colon   . Synovitis and tenosynovitis 01/06/2019  . Strain of peroneal tendon 12/23/2018  . Closed dislocation of ankle 04/29/2018  . Elevated  blood pressure reading 09/02/2017  . Neuropathy of right ankle 09/02/2017  . Obesity (BMI 35.0-39.9 without comorbidity) 09/02/2017  . Injury of nerve of lower extremity 09/02/2017    . Closed bimalleolar fracture 09/26/2016  . Loose body in ankle and foot joint 09/26/2016  . Sprain of deltoid ligament of ankle 09/26/2016    No Known Allergies  Past Surgical History:  Procedure Laterality Date  . ANTERIOR CERVICAL DECOMP/DISCECTOMY FUSION N/A 06/22/2019   Procedure: ANTERIOR CERVICAL DISCECTOMY FUSION C5-6 with plates, screws, allograft bone graft, local bone graft and Vivigen;  Surgeon: Jessy Oto, MD;  Location: [facility];  Service: Orthopedics;  Laterality: N/A;  . COLONOSCOPY WITH PROPOFOL N/A 04/30/2019   Procedure: COLONOSCOPY WITH BIOPSY;  Surgeon: Lucilla Lame, MD;  Location: [facility];  Service: Endoscopy;  Laterality: N/A;  . LEG SURGERY Right 2017, 2018   fractured leg and ankle- has screws and pins  . POLYPECTOMY N/A 04/30/2019   Procedure: POLYPECTOMY;  Surgeon: Lucilla Lame, MD;  Location: [facility];  Service: Endoscopy;  Laterality: N/A;  . SHOULDER SURGERY Left 2013   labrial tear  . WISDOM TOOTH EXTRACTION      Social History   Tobacco Use  . Smoking status: Never Smoker  . Smokeless tobacco: Never Used  Vaping Use  . Vaping Use: Never used  Substance Use Topics  . Alcohol use: Yes    Alcohol/week: 1.0 standard drink    Types: 1 Cans of beer per week    Comment: occasional  . Drug use: Never     Medication list has been reviewed and updated.  Current Meds  Medication Sig  . celecoxib (CELEBREX) 200 MG capsule Take 200 mg by mouth daily.  Marland Kitchen gabapentin (NEURONTIN) 800 MG tablet Take 800 mg by mouth 3 (three) times daily. As directed by pain management  . lisinopril (ZESTRIL) 10 MG tablet Take 1 tablet (10 mg total) by mouth daily.    PHQ 2/9 Scores 08/23/2020 03/01/2020 02/02/2020 03/24/2019  PHQ - 2 Score 0 0 0 0  PHQ- 9 Score 0 0 2 0    GAD 7 : Generalized Anxiety Score 08/23/2020 03/01/2020 02/02/2020  Nervous, Anxious, on Edge 0 0 0  Control/stop worrying 0 0 0  Worry too much - different things 0 0 0   Trouble relaxing 0 0 0  Restless 0 0 0  Easily annoyed or irritable 0 0 2  Afraid - awful might happen 0 0 0  Total GAD 7 Score 0 0 2  Anxiety Difficulty - - Not difficult at all    BP Readings from Last 3 Encounters:  08/23/20 120/80  08/18/20 129/84  07/20/20 138/82    Physical Exam Vitals and nursing note reviewed.  HENT:     Head: Normocephalic.     Right Ear: Tympanic membrane, ear canal and external ear normal.     Left Ear: Tympanic membrane, ear canal and external ear normal.     Nose: Nose normal. No congestion or rhinorrhea.     Mouth/Throat:     Mouth: Mucous membranes are moist.  Eyes:     General: No scleral icterus.       Right eye: No discharge.        Left eye: No discharge.     Conjunctiva/sclera: Conjunctivae normal.     Pupils: Pupils are equal, round, and reactive to light.  Neck:     Thyroid: No thyromegaly.     Vascular: No JVD.  Trachea: No tracheal deviation.  Cardiovascular:     Rate and Rhythm: Normal rate and regular rhythm.     Heart sounds: Normal heart sounds. No murmur heard.  No friction rub. No gallop.   Pulmonary:     Effort: No respiratory distress.     Breath sounds: Normal breath sounds. No stridor. No wheezing, rhonchi or rales.  Abdominal:     General: Bowel sounds are normal.     Palpations: Abdomen is soft. There is no mass.     Tenderness: There is no abdominal tenderness. There is no guarding or rebound.  Musculoskeletal:        General: No tenderness. Normal range of motion.     Cervical back: Normal range of motion and neck supple.  Lymphadenopathy:     Cervical: No cervical adenopathy.  Skin:    General: Skin is warm.     Capillary Refill: Capillary refill takes less than 2 seconds.     Findings: No rash.  Neurological:     Mental Status: He is alert and oriented to person, place, and time.     Cranial Nerves: No cranial nerve deficit.     Deep Tendon Reflexes: Reflexes are normal and symmetric.     Wt  Readings from Last 3 Encounters:  08/23/20 268 lb (121.6 kg)  08/18/20 268 lb 6.4 oz (121.7 kg)  07/20/20 268 lb 6.4 oz (121.7 kg)    BP 120/80   Pulse 84   Ht 6' (1.829 m)   Wt 268 lb (121.6 kg)   BMI 36.35 kg/m   Assessment and Plan: 1. Elevated blood pressure reading Chronic.  Controlled.  Stable.  Blood pressure 120/80.  We will continue lisinopril 10 mg once a day.  Will check renal function panel to assess electrolytes and GFR. - lisinopril (ZESTRIL) 10 MG tablet; Take 1 tablet (10 mg total) by mouth daily.  Dispense: 90 tablet; Refill: 1 - Renal Function Panel  2. Mixed hyperlipidemia Chronic.  Diet controlled.  Stable.  Will recheck lipid panel and determine further approach if necessary. - Lipid Panel With LDL/HDL Ratio  3. Need for immunization against influenza Discussed and administered. - Flu Vaccine QUAD 36+ mos IM

## 2020-08-24 ENCOUNTER — Other Ambulatory Visit: Payer: Self-pay

## 2020-08-24 DIAGNOSIS — E782 Mixed hyperlipidemia: Secondary | ICD-10-CM

## 2020-08-24 LAB — LIPID PANEL WITH LDL/HDL RATIO
Cholesterol, Total: 252 mg/dL — ABNORMAL HIGH (ref 100–199)
HDL: 39 mg/dL — ABNORMAL LOW (ref >39)
LDL Chol Calc (NIH): 168 mg/dL — ABNORMAL HIGH (ref 0–99)
LDL/HDL Ratio: 4.3 ratio — ABNORMAL HIGH (ref 0.0–3.6)
Triglycerides: 238 mg/dL — ABNORMAL HIGH (ref 0–149)
VLDL Cholesterol Cal: 45 mg/dL — ABNORMAL HIGH (ref 5–40)

## 2020-08-24 LAB — RENAL FUNCTION PANEL
Albumin: 4.3 g/dL (ref 3.8–4.9)
BUN/Creatinine Ratio: 16 (ref 9–20)
BUN: 21 mg/dL (ref 6–24)
CO2: 26 mmol/L (ref 20–29)
Calcium: 9.9 mg/dL (ref 8.7–10.2)
Chloride: 100 mmol/L (ref 96–106)
Creatinine, Ser: 1.32 mg/dL — ABNORMAL HIGH (ref 0.76–1.27)
GFR calc Af Amer: 71 mL/min/{1.73_m2} (ref >59)
GFR calc non Af Amer: 61 mL/min/{1.73_m2} (ref >59)
Glucose: 122 mg/dL — ABNORMAL HIGH (ref 65–99)
Phosphorus: 3.4 mg/dL (ref 2.8–4.1)
Potassium: 4.7 mmol/L (ref 3.5–5.2)
Sodium: 139 mmol/L (ref 134–144)

## 2020-08-24 MED ORDER — ATORVASTATIN CALCIUM 10 MG PO TABS: 10.0000 mg | ORAL_TABLET | Freq: Every day | ORAL | 1 refills | Status: DC

## 2020-08-24 NOTE — Progress Notes (Unsigned)
Sent in atorv 

## 2020-09-01 ENCOUNTER — Ambulatory Visit: Payer: Medicare Other | Admitting: Surgery

## 2020-09-12 ENCOUNTER — Other Ambulatory Visit: Payer: Self-pay

## 2020-09-12 ENCOUNTER — Ambulatory Visit: Payer: Self-pay

## 2020-09-12 ENCOUNTER — Ambulatory Visit (INDEPENDENT_AMBULATORY_CARE_PROVIDER_SITE_OTHER): Payer: Medicare Other | Admitting: Physical Medicine and Rehabilitation

## 2020-09-12 ENCOUNTER — Encounter: Payer: Self-pay | Admitting: Physical Medicine and Rehabilitation

## 2020-09-12 VITALS — BP 101/71 | HR 68

## 2020-09-12 DIAGNOSIS — M5116 Intervertebral disc disorders with radiculopathy, lumbar region: Secondary | ICD-10-CM

## 2020-09-12 MED ORDER — METHYLPREDNISOLONE ACETATE 80 MG/ML IJ SUSP
80.0000 mg | Freq: Once | INTRAMUSCULAR | Status: AC
  Administered 2020-09-12: 80 mg

## 2020-09-12 NOTE — Progress Notes (Signed)
Pt state lower back pain. Pt state bending over and climbing stairs makes the pain worse. Pt state he takes pain meds and use heating pads to help ease his pain.   Numeric Pain Rating Scale and Functional Assessment Average Pain 4   In the last MONTH (on 0-10 scale) has pain interfered with the following?  1. General activity like being  able to carry out your everyday physical activities such as walking, climbing stairs, carrying groceries, or moving a chair?  Rating(8)   +Driver, -BT, -Dye Allergies.

## 2020-09-12 NOTE — Procedures (Signed)
Lumbar Epidural Steroid Injection - Interlaminar Approach with Fluoroscopic Guidance  Patient: Adam Hernandez      Date of Birth: 1966-08-06 MRN: [id number] PCP: Juline Patch, MD      Visit Date: 09/12/2020   Universal Protocol:     Consent Given By: the patient  Position: PRONE  Additional Comments: Vital signs were monitored before and after the procedure. Patient was prepped and draped in the usual sterile fashion. The correct patient, procedure, and site was verified.   Injection Procedure Details:   Procedure diagnoses:  1. Radiculopathy due to lumbar intervertebral disc disorder      Meds Administered:  Meds ordered this encounter  Medications  . methylPREDNISolone acetate (DEPO-MEDROL) injection 80 mg     Laterality: Bilateral  Location/Site:  L5-S1  Needle size: 20 G  Needle type: Tuohy  Needle Placement: Paramedian epidural  Findings:   -Comments: Excellent flow of contrast into the epidural space.  Procedure Details: Using a paramedian approach from the side mentioned above, the region overlying the inferior lamina was localized under fluoroscopic visualization and the soft tissues overlying this structure were infiltrated with 4 ml. of 1% Lidocaine without Epinephrine. The Tuohy needle was inserted into the epidural space using a paramedian approach.   The epidural space was localized using loss of resistance along with counter oblique bi-planar fluoroscopic views.  After negative aspirate for air, blood, and CSF, a 2 ml. volume of Isovue-250 was injected into the epidural space and the flow of contrast was observed. Radiographs were obtained for documentation purposes.    The injectate was administered into the level noted above.   Additional Comments:  The patient tolerated the procedure well Dressing: 2 x 2 sterile gauze and Band-Aid    Post-procedure details: Patient was observed during the procedure. Post-procedure instructions were  reviewed.  Patient left the clinic in stable condition.

## 2020-09-12 NOTE — Progress Notes (Signed)
Adam Hernandez - 54 y.o. male MRN [id number]  Date of birth: December 27, 1965  Office Visit Note: Visit Date: 09/12/2020 PCP: Juline Patch, MD Referred by: Juline Patch, MD  Subjective: Chief Complaint  Patient presents with  . Lower Back - Pain   HPI:  Adam Hernandez is a 54 y.o. male who comes in today at the request of Dr. [name] for planned Bilateral L4-L5 Lumbar epidural steroid injection with fluoroscopic guidance.  The patient has failed conservative care including home exercise, medications, time and activity modification.  This injection will be diagnostic and hopefully therapeutic.  Please see requesting physician notes for further details and justification.   ROS Otherwise per HPI.  Assessment & Plan: Visit Diagnoses:  1. Radiculopathy due to lumbar intervertebral disc disorder     Plan: No additional findings.   Meds & Orders:  Meds ordered this encounter  Medications  . methylPREDNISolone acetate (DEPO-MEDROL) injection 80 mg    Orders Placed This Encounter  Procedures  . XR C-ARM NO REPORT  . Epidural Steroid injection    Follow-up: No follow-ups on file.   Procedures: No procedures performed  No notes on file   Clinical History: MRI CERVICAL SPINE WITHOUT CONTRAST  TECHNIQUE: Multiplanar, multisequence MR imaging of the cervical spine was performed. No intravenous contrast was administered.  COMPARISON:  Cervical radiographs 01/15/2019  FINDINGS: Alignment: Normal  Vertebrae: Normal bone marrow.  Negative for fracture or mass  Cord: No cord signal abnormality identified. Cord evaluation limited by motion  Posterior Fossa, vertebral arteries, paraspinal tissues: Negative  Disc levels:  C2-3: Negative  C3-4: Negative  C4-5: Left foraminal encroachment due to spurring with expected impingement left C5 nerve root. Spinal canal and right foramen patent  C5-6: Moderate disc degeneration with diffuse uncinate  spurring. Moderate right foraminal encroachment and severe left foraminal encroachment. Cord flattening with moderate spinal stenosis.  C6-7: Right paracentral disc protrusion with mild right foraminal encroachment. Cord flattening and mild spinal stenosis on the right  C7-T1: Bilateral facet degeneration contributing to mild foraminal narrowing bilaterally.  IMPRESSION: Left foraminal encroachment C4-5  Moderate right foraminal encroachment and severe left foraminal encroachment C5-6 due to spurring. Moderate spinal stenosis C5-6  Right sided disc protrusion at C6-7 with mild right foraminal encroachment  Mild foraminal narrowing bilaterally C7-T1.   Electronically Signed   By: Franchot Gallo M.D.   On: 02/19/2019 10:43     Objective:  VS:  HT:    WT:   BMI:     BP:101/71  HR:68bpm  TEMP: ( )  RESP:  Physical Exam Constitutional:      General: He is not in acute distress.    Appearance: Normal appearance. He is obese. He is not ill-appearing.  HENT:     Head: Normocephalic and atraumatic.     Right Ear: External ear normal.     Left Ear: External ear normal.  Eyes:     Extraocular Movements: Extraocular movements intact.  Cardiovascular:     Rate and Rhythm: Normal rate.     Pulses: Normal pulses.  Abdominal:     General: There is no distension.     Palpations: Abdomen is soft.  Musculoskeletal:        General: No tenderness or signs of injury.     Right lower leg: No edema.     Left lower leg: No edema.     Comments: Patient has good distal strength without clonus.  Skin:  Findings: No erythema or rash.  Neurological:     General: No focal deficit present.     Mental Status: He is alert and oriented to person, place, and time.     Sensory: No sensory deficit.     Motor: No weakness or abnormal muscle tone.     Coordination: Coordination normal.  Psychiatric:        Mood and Affect: Mood normal.        Behavior: Behavior normal.       Imaging: No results found.

## 2020-09-22 ENCOUNTER — Encounter: Payer: Self-pay | Admitting: Family Medicine

## 2020-09-22 ENCOUNTER — Encounter: Payer: Self-pay | Admitting: Specialist

## 2020-09-22 ENCOUNTER — Other Ambulatory Visit: Payer: Self-pay

## 2020-09-22 ENCOUNTER — Ambulatory Visit: Payer: Medicare Other | Admitting: Specialist

## 2020-09-22 VITALS — BP 115/79 | HR 73 | Ht 72.0 in | Wt 268.0 lb

## 2020-09-22 DIAGNOSIS — M5412 Radiculopathy, cervical region: Secondary | ICD-10-CM | POA: Diagnosis not present

## 2020-09-22 DIAGNOSIS — M545 Low back pain, unspecified: Secondary | ICD-10-CM | POA: Diagnosis not present

## 2020-09-22 DIAGNOSIS — M5416 Radiculopathy, lumbar region: Secondary | ICD-10-CM

## 2020-09-22 DIAGNOSIS — M47819 Spondylosis without myelopathy or radiculopathy, site unspecified: Secondary | ICD-10-CM

## 2020-09-22 DIAGNOSIS — M4807 Spinal stenosis, lumbosacral region: Secondary | ICD-10-CM

## 2020-09-22 NOTE — Patient Instructions (Signed)
Plan: Avoid frequent bending and stooping  No lifting greater than 10 lbs. May use ice or moist heat for pain. Weight loss is of benefit. Best medication for lumbar disc disease is arthritis medications like motrin, celebrex and naprosyn. Exercise is important to improve your indurance and does allow people to function better inspite of back pain. Call for further consideration of ESI if pain worsens.

## 2020-09-22 NOTE — Progress Notes (Addendum)
Office Visit Note   Patient: Adam Hernandez           Date of Birth: 02/20/1966           MRN: [id number] Visit Date: 09/22/2020              Requested by: Juline Patch, MD [address] [place],  [place] [address] PCP: Juline Patch, MD   Assessment & Plan: Visit Diagnoses:  1. Spondylosis without myelopathy or radiculopathy   2. Cervical radiculopathy   3. Low back pain, unspecified back pain laterality, unspecified chronicity, unspecified whether sciatica present   4. Spinal stenosis of lumbosacral region   5. Radiculopathy, lumbar region     Plan: Avoid frequent bending and stooping  No lifting greater than 10 lbs. May use ice or moist heat for pain. Weight loss is of benefit. Best medication for lumbar disc disease is arthritis medications like motrin, celebrex and naprosyn. Exercise is important to improve your indurance and does allow people to function better inspite of back pain. Call for further consideration of ESI if pain worsens.    Follow-Up Instructions: Return in about 4 weeks (around 10/20/2020).   Orders:  No orders of the defined types were placed in this encounter.  No orders of the defined types were placed in this encounter.     Procedures: No procedures performed   Clinical Data: No additional findings.   Subjective: Chief Complaint  Patient presents with  . Lower Back - Follow-up    He had Bilat L5-S1 IL done with Dr. Ernestina Patches on 09/12/20--states that he has got approx 80% relief from it, does not have the sharp pains with doing steps.    54 year old male with lumbar spinal stenosis with recurring leg and back pain underwent bilateral IL ESIs at L5-S1 with about 80% improvement in his pain. He is to undergo a neurolysis of the right superficial peroneal n and exploration and neurolysis of the saphenous n.  Overall back buttock and posterior thigh And calf pain is improved.  No bowel or bladder difficulty..    Review of  Systems  Constitutional: Negative.   HENT: Negative.   Eyes: Negative.   Respiratory: Negative.   Cardiovascular: Negative.   Gastrointestinal: Negative.   Endocrine: Negative.   Genitourinary: Negative.   Musculoskeletal: Negative.   Skin: Negative.   Allergic/Immunologic: Negative.   Neurological: Negative.   Hematological: Negative.   Psychiatric/Behavioral: Negative.      Objective: Vital Signs: BP 115/79 (BP Location: Left Arm, Patient Position: Sitting)   Pulse 73   Ht 6' (1.829 m)   Wt 268 lb (121.6 kg)   BMI 36.35 kg/m   Physical Exam Constitutional:      Appearance: He is well-developed.  HENT:     Head: Normocephalic and atraumatic.  Eyes:     Pupils: Pupils are equal, round, and reactive to light.  Pulmonary:     Effort: Pulmonary effort is normal.     Breath sounds: Normal breath sounds.  Abdominal:     General: Bowel sounds are normal.     Palpations: Abdomen is soft.  Musculoskeletal:        General: Normal range of motion.     Cervical back: Normal range of motion and neck supple.  Skin:    General: Skin is warm and dry.  Neurological:     Mental Status: He is alert and oriented to person, place, and time.  Psychiatric:  Behavior: Behavior normal.        Thought Content: Thought content normal.        Judgment: Judgment normal.     Back Exam   Muscle Strength  Right Quadriceps:  5/5  Left Quadriceps:  5/5  Right Hamstrings:  5/5  Left Hamstrings:  5/5   Reflexes  Achilles: 0/4  Comments:  Able to walk stairs without pain rigt foot numbness, Positive TInels right anterolateral distal leg in area of superficial peroneal n.  Also numbness medial ankle      Specialty Comments:  No specialty comments available.  Imaging: No results found.   PMFS History: Patient Active Problem List   Diagnosis Date Noted  . Spondylosis without myelopathy or radiculopathy, cervical region 06/22/2019    Priority: Medium    Class: Chronic   . Herniation of cervical intervertebral disc with radiculopathy 06/22/2019  . Status post cervical spinal fusion 06/22/2019  . Encounter for screening colonoscopy   . Polyp of sigmoid colon   . Synovitis and tenosynovitis 01/06/2019  . Strain of peroneal tendon 12/23/2018  . Closed dislocation of ankle 04/29/2018  . Elevated blood pressure reading 09/02/2017  . Neuropathy of right ankle 09/02/2017  . Obesity (BMI 35.0-39.9 without comorbidity) 09/02/2017  . Injury of nerve of lower extremity 09/02/2017  . Closed bimalleolar fracture 09/26/2016  . Loose body in ankle and foot joint 09/26/2016  . Sprain of deltoid ligament of ankle 09/26/2016   Past Medical History:  Diagnosis Date  . Anxiety   . Arm numbness left    cervical disc issues  . Arthritis    right leg  . GERD (gastroesophageal reflux disease)   . Motion sickness    ocean boats  . Neuromuscular disorder ([place])   . Neuropathy   . Obesity (BMI 35.0-39.9 without comorbidity) 09/02/2017    Family History  Adopted: Yes    Past Surgical History:  Procedure Laterality Date  . ANTERIOR CERVICAL DECOMP/DISCECTOMY FUSION N/A 06/22/2019   Procedure: ANTERIOR CERVICAL DISCECTOMY FUSION C5-6 with plates, screws, allograft bone graft, local bone graft and Vivigen;  Surgeon: Jessy Oto, MD;  Location: [facility];  Service: Orthopedics;  Laterality: N/A;  . COLONOSCOPY WITH PROPOFOL N/A 04/30/2019   Procedure: COLONOSCOPY WITH BIOPSY;  Surgeon: Lucilla Lame, MD;  Location: [facility];  Service: Endoscopy;  Laterality: N/A;  . LEG SURGERY Right 2017, 2018   fractured leg and ankle- has screws and pins  . POLYPECTOMY N/A 04/30/2019   Procedure: POLYPECTOMY;  Surgeon: Lucilla Lame, MD;  Location: [facility];  Service: Endoscopy;  Laterality: N/A;  . SHOULDER SURGERY Left 2013   labrial tear  . WISDOM TOOTH EXTRACTION     Social History   Occupational History  . Not on file  Tobacco Use  . Smoking status: Never  Smoker  . Smokeless tobacco: Never Used  Vaping Use  . Vaping Use: Never used  Substance and Sexual Activity  . Alcohol use: Yes    Alcohol/week: 1.0 standard drink    Types: 1 Cans of beer per week    Comment: occasional  . Drug use: Never  . Sexual activity: Yes

## 2020-10-16 ENCOUNTER — Encounter: Payer: Self-pay | Admitting: Specialist

## 2020-10-16 ENCOUNTER — Encounter: Payer: Self-pay | Admitting: Family Medicine

## 2020-10-17 ENCOUNTER — Other Ambulatory Visit: Payer: Self-pay

## 2020-10-17 MED ORDER — CELECOXIB 200 MG PO CAPS: 200.0000 mg | ORAL_CAPSULE | Freq: Every day | ORAL | 0 refills | Status: AC

## 2020-10-18 ENCOUNTER — Other Ambulatory Visit: Payer: Self-pay

## 2020-10-18 ENCOUNTER — Other Ambulatory Visit: Payer: Medicare Other

## 2020-10-18 DIAGNOSIS — R7309 Other abnormal glucose: Secondary | ICD-10-CM

## 2020-10-18 DIAGNOSIS — E782 Mixed hyperlipidemia: Secondary | ICD-10-CM

## 2020-10-18 DIAGNOSIS — R69 Illness, unspecified: Secondary | ICD-10-CM

## 2020-10-19 ENCOUNTER — Other Ambulatory Visit: Payer: Self-pay

## 2020-10-19 DIAGNOSIS — E782 Mixed hyperlipidemia: Secondary | ICD-10-CM

## 2020-10-19 LAB — LIPID PANEL WITH LDL/HDL RATIO
Cholesterol, Total: 179 mg/dL (ref 100–199)
HDL: 42 mg/dL (ref >39)
LDL Chol Calc (NIH): 110 mg/dL — ABNORMAL HIGH (ref 0–99)
LDL/HDL Ratio: 2.6 ratio (ref 0.0–3.6)
Triglycerides: 154 mg/dL — ABNORMAL HIGH (ref 0–149)
VLDL Cholesterol Cal: 27 mg/dL (ref 5–40)

## 2020-10-19 LAB — HEPATIC FUNCTION PANEL
ALT: 20 IU/L (ref 0–44)
AST: 14 IU/L (ref 0–40)
Albumin: 4.3 g/dL (ref 3.8–4.9)
Alkaline Phosphatase: 97 IU/L (ref 44–121)
Bilirubin Total: 0.5 mg/dL (ref 0.0–1.2)
Bilirubin, Direct: 0.14 mg/dL (ref 0.00–0.40)
Total Protein: 6.9 g/dL (ref 6.0–8.5)

## 2020-10-19 LAB — HEMOGLOBIN A1C
Est. average glucose Bld gHb Est-mCnc: 137 mg/dL
Hgb A1c MFr Bld: 6.4 % — ABNORMAL HIGH (ref 4.8–5.6)

## 2020-10-19 MED ORDER — ATORVASTATIN CALCIUM 10 MG PO TABS: 10.0000 mg | ORAL_TABLET | Freq: Every day | ORAL | 1 refills | Status: DC

## 2020-12-23 ENCOUNTER — Encounter: Payer: Self-pay | Admitting: Specialist

## 2020-12-23 ENCOUNTER — Other Ambulatory Visit: Payer: Self-pay

## 2020-12-23 ENCOUNTER — Ambulatory Visit: Payer: Medicare Other | Admitting: Specialist

## 2020-12-23 VITALS — BP 137/89 | HR 76 | Ht 71.0 in | Wt 271.0 lb

## 2020-12-23 DIAGNOSIS — G5731 Lesion of lateral popliteal nerve, right lower limb: Secondary | ICD-10-CM

## 2020-12-23 DIAGNOSIS — Z981 Arthrodesis status: Secondary | ICD-10-CM

## 2020-12-23 DIAGNOSIS — M4807 Spinal stenosis, lumbosacral region: Secondary | ICD-10-CM | POA: Diagnosis not present

## 2020-12-23 DIAGNOSIS — M47812 Spondylosis without myelopathy or radiculopathy, cervical region: Secondary | ICD-10-CM | POA: Diagnosis not present

## 2020-12-23 DIAGNOSIS — M4722 Other spondylosis with radiculopathy, cervical region: Secondary | ICD-10-CM

## 2020-12-23 DIAGNOSIS — M542 Cervicalgia: Secondary | ICD-10-CM

## 2020-12-23 NOTE — Progress Notes (Signed)
Office Visit Note   Patient: Adam Hernandez           Date of Birth: Aug 18, 1966           MRN: [id number] Visit Date: 12/23/2020              Requested by: Juline Patch, MD [address] [place],  [place] [address] PCP: Juline Patch, MD   Assessment & Plan: Visit Diagnoses: No diagnosis found.  Plan: Avoid bending, stooping and avoid lifting weights greater than 10 lbs. Avoid prolong standing and walking. Order for a new walker with wheels. Surgery scheduling secretary Kandice Hams, will call you in the next week to schedule for surgery.  Surgery recommended is a bilateral partial hemilaminectomy at L4-5 this with a microscope and smaller incision. Gabapentin and celebrex for pain. Risk of surgery includes risk of infection 1 in 200 patients, bleeding 1/2% chance you would need a transfusion.   Risk to the nerves is one in 10,000.Marland Kitchen Expect improved walking and standing tolerance. Expect relief of leg pain but numbness may persist depending on the length and degree of pressure that has been present.    Follow-Up Instructions: No follow-ups on file.   Orders:  No orders of the defined types were placed in this encounter.  No orders of the defined types were placed in this encounter.     Procedures: No procedures performed   Clinical Data: No additional findings.   Subjective: Chief Complaint  Patient presents with  . Lower Back - Follow-up    55 year old male with history of lumbar radiculopathy and sciatica. Pain is into the right leg and foot and he has a left superficial peroneal neuropathy due to previous right ankle fracture disclocation. He was considering having right anklle fusion vs joint replacement. Had right L4-5 ESI with good relief of pain for a period of nearly 4-5 weeks and now pain is recurrent with pain now into the left leg and into the left  Great toe and 2nd toe. MRI in 07/2020 at [facility] showed bilateral lateral recess  stenosis with L5 nerve compression. His ability to stand and walk is impeded by the back and leg pains. The pain into the leg is better with sitting. No bowel or bladder difficulty.    Review of Systems  Constitutional: Negative.   HENT: Negative.   Eyes: Negative.   Respiratory: Negative.   Cardiovascular: Negative.   Gastrointestinal: Negative.   Endocrine: Negative.   Genitourinary: Negative.   Musculoskeletal: Negative.   Skin: Negative.   Allergic/Immunologic: Negative.   Neurological: Negative.   Hematological: Negative.   Psychiatric/Behavioral: Negative.      Objective: Vital Signs: BP 137/89 (BP Location: Left Arm, Patient Position: Sitting)   Pulse 76   Ht 5\' 11"  (1.803 m)   Wt 271 lb (122.9 kg)   BMI 37.80 kg/m   Physical Exam Constitutional:      Appearance: He is well-developed and well-nourished.  HENT:     Head: Normocephalic and atraumatic.  Eyes:     Extraocular Movements: EOM normal.     Pupils: Pupils are equal, round, and reactive to light.  Pulmonary:     Effort: Pulmonary effort is normal.     Breath sounds: Normal breath sounds.  Abdominal:     General: Bowel sounds are normal.     Palpations: Abdomen is soft.  Musculoskeletal:     Cervical back: Normal range of motion and neck supple.  Lumbar back: Negative right straight leg raise test and negative left straight leg raise test.  Skin:    General: Skin is warm and dry.  Neurological:     Mental Status: He is alert and oriented to person, place, and time.  Psychiatric:        Mood and Affect: Mood and affect normal.        Behavior: Behavior normal.        Thought Content: Thought content normal.        Judgment: Judgment normal.     Back Exam   Tenderness  The patient is experiencing tenderness in the lumbar.  Range of Motion  Extension: abnormal  Flexion: abnormal  Lateral bend right: normal  Lateral bend left: normal  Rotation right: normal  Rotation left: normal    Muscle Strength  Right Quadriceps:  5/5  Left Quadriceps:  5/5  Right Hamstrings:  5/5  Left Hamstrings:  5/5   Tests  Straight leg raise right: negative Straight leg raise left: negative  Reflexes  Patellar: 2/4 Achilles: 2/4 Babinski's sign: normal   Other  Toe walk: abnormal Heel walk: abnormal Gait: abnormal  Erythema: no back redness Scars: absent  Comments:  Weak bilateral left greater than right EHL 4/5.       Specialty Comments:  No specialty comments available.  Imaging: No results found.   PMFS History: Patient Active Problem List   Diagnosis Date Noted  . Spondylosis without myelopathy or radiculopathy, cervical region 06/22/2019    Priority: Medium    Class: Chronic  . Herniation of cervical intervertebral disc with radiculopathy 06/22/2019  . Status post cervical spinal fusion 06/22/2019  . Encounter for screening colonoscopy   . Polyp of sigmoid colon   . Synovitis and tenosynovitis 01/06/2019  . Strain of peroneal tendon 12/23/2018  . Closed dislocation of ankle 04/29/2018  . Elevated blood pressure reading 09/02/2017  . Neuropathy of right ankle 09/02/2017  . Obesity (BMI 35.0-39.9 without comorbidity) 09/02/2017  . Injury of nerve of lower extremity 09/02/2017  . Closed bimalleolar fracture 09/26/2016  . Loose body in ankle and foot joint 09/26/2016  . Sprain of deltoid ligament of ankle 09/26/2016   Past Medical History:  Diagnosis Date  . Anxiety   . Arm numbness left    cervical disc issues  . Arthritis    right leg  . GERD (gastroesophageal reflux disease)   . Motion sickness    ocean boats  . Neuromuscular disorder ([place])   . Neuropathy   . Obesity (BMI 35.0-39.9 without comorbidity) 09/02/2017    Family History  Adopted: Yes    Past Surgical History:  Procedure Laterality Date  . ANTERIOR CERVICAL DECOMP/DISCECTOMY FUSION N/A 06/22/2019   Procedure: ANTERIOR CERVICAL DISCECTOMY FUSION C5-6 with plates, screws,  allograft bone graft, local bone graft and Vivigen;  Surgeon: Jessy Oto, MD;  Location: [facility];  Service: Orthopedics;  Laterality: N/A;  . COLONOSCOPY WITH PROPOFOL N/A 04/30/2019   Procedure: COLONOSCOPY WITH BIOPSY;  Surgeon: Lucilla Lame, MD;  Location: [facility];  Service: Endoscopy;  Laterality: N/A;  . LEG SURGERY Right 2017, 2018   fractured leg and ankle- has screws and pins  . POLYPECTOMY N/A 04/30/2019   Procedure: POLYPECTOMY;  Surgeon: Lucilla Lame, MD;  Location: [facility];  Service: Endoscopy;  Laterality: N/A;  . SHOULDER SURGERY Left 2013   labrial tear  . WISDOM TOOTH EXTRACTION     Social History   Occupational History  .  Not on file  Tobacco Use  . Smoking status: Never Smoker  . Smokeless tobacco: Never Used  Vaping Use  . Vaping Use: Never used  Substance and Sexual Activity  . Alcohol use: Yes    Alcohol/week: 1.0 standard drink    Types: 1 Cans of beer per week    Comment: occasional  . Drug use: Never  . Sexual activity: Yes

## 2020-12-23 NOTE — Patient Instructions (Signed)
Plan: Avoid bending, stooping and avoid lifting weights greater than 10 lbs. Avoid prolong standing and walking. Order for a new walker with wheels. Surgery scheduling secretary Kandice Hams, will call you in the next week to schedule for surgery.  Surgery recommended is a bilateral partial hemilaminectomy at L4-5 this with a microscope and smaller incision. Gabapentin and celebrex for pain. Risk of surgery includes risk of infection 1 in 200 patients, bleeding 1/2% chance you would need a transfusion.   Risk to the nerves is one in 10,000.Marland Kitchen Expect improved walking and standing tolerance. Expect relief of leg pain but numbness may persist depending on the length and degree of pressure that has been present.

## 2021-01-09 ENCOUNTER — Encounter: Payer: Self-pay | Admitting: Specialist

## 2021-01-20 ENCOUNTER — Encounter: Payer: Self-pay | Admitting: Specialist

## 2021-01-20 ENCOUNTER — Other Ambulatory Visit: Payer: Self-pay

## 2021-01-20 ENCOUNTER — Ambulatory Visit: Payer: Medicare Other | Admitting: Specialist

## 2021-01-20 VITALS — BP 142/86 | HR 71 | Ht 71.0 in | Wt 271.0 lb

## 2021-01-20 DIAGNOSIS — M47812 Spondylosis without myelopathy or radiculopathy, cervical region: Secondary | ICD-10-CM | POA: Diagnosis not present

## 2021-01-20 DIAGNOSIS — M48062 Spinal stenosis, lumbar region with neurogenic claudication: Secondary | ICD-10-CM

## 2021-01-20 DIAGNOSIS — M4807 Spinal stenosis, lumbosacral region: Secondary | ICD-10-CM

## 2021-01-20 DIAGNOSIS — M5416 Radiculopathy, lumbar region: Secondary | ICD-10-CM

## 2021-01-20 NOTE — Progress Notes (Addendum)
Office Visit Note   Patient: Adam Hernandez           Date of Birth: 08/11/1966           MRN: [id number] Visit Date: 01/20/2021              Requested by: Juline Patch, MD [address] [place],  [place] [address] PCP: Juline Patch, MD   Assessment & Plan: Visit Diagnoses:  1. Spinal stenosis of lumbosacral region   2. Spondylosis without myelopathy or radiculopathy, cervical region   3. Radiculopathy, lumbar region    Plan: Avoid bending, stooping and avoid lifting weights greater than 10 lbs. Avoid prolong standing and walking. Order for a new walker with wheels. Surgery scheduling secretary Kandice Hams, will call you in the next week to schedule for surgery.  Surgery recommended is a bilateral partial hemilaminectomy at L4-5 this with a microscope and smaller incision. Gabapentin and celebrex for pain. Risk of surgery includes risk of infection 1 in 200 patients, bleeding 1/2% chance you would need a transfusion.   Risk to the nerves is one in 10,000.Marland Kitchen Expect improved walking and standing tolerance. Expect relief of leg pain but numbness may persist depending on the length and degree of pressure that has been present.  Surgery L4-5 for lateral recess stenosis.   Follow-Up Instructions: No follow-ups on file.   Orders:  No orders of the defined types were placed in this encounter.  No orders of the defined types were placed in this encounter.     Procedures: No procedures performed   Clinical Data: No additional findings.   Subjective: Chief Complaint  Patient presents with  . Neck - Follow-up  . Lower Back - Follow-up    55 year old male with history of cervical fusion and previous shoulder surgery left with labral tear repair and reconstruction. He reports that he fell on uneven turf at his house about 11 days ago and has had some discomfort left shoulder. Has persistent pain with standing and walking and radiaiton into the left leg, he  also has right anterolateral ankle pain that has be ascribed to superficial peroneal n entrapment  Post ORIF right ankle fracture dislocation. His pain pattern for the lumbar spine is consistent with neurogenic claudication as pain worsens with standing and walking and improves with sitting. Pain is left leg greater than right. He would prefer if intervention that it be done in May after his father is over a planned hip replacement surgery he would like to help in the recovery period.    Review of Systems  Constitutional: Negative.   HENT: Negative.   Eyes: Negative.   Respiratory: Negative.   Cardiovascular: Negative.   Gastrointestinal: Negative.   Endocrine: Negative.   Genitourinary: Negative.   Musculoskeletal: Negative.   Skin: Negative.   Allergic/Immunologic: Negative.   Neurological: Negative.   Hematological: Negative.   Psychiatric/Behavioral: Negative.      Objective: Vital Signs: BP (!) 142/86 (BP Location: Left Arm, Patient Position: Sitting)   Pulse 71   Ht 5\' 11"  (1.803 m)   Wt 271 lb (122.9 kg)   BMI 37.80 kg/m   Physical Exam Constitutional:      Appearance: He is well-developed.  HENT:     Head: Normocephalic and atraumatic.  Eyes:     Pupils: Pupils are equal, round, and reactive to light.  Pulmonary:     Effort: Pulmonary effort is normal.     Breath sounds:  Normal breath sounds.  Abdominal:     General: Bowel sounds are normal.     Palpations: Abdomen is soft.  Musculoskeletal:     Cervical back: Normal range of motion and neck supple.     Lumbar back: Positive right straight leg raise test and positive left straight leg raise test.  Skin:    General: Skin is warm and dry.  Neurological:     Mental Status: He is alert and oriented to person, place, and time.  Psychiatric:        Behavior: Behavior normal.        Thought Content: Thought content normal.        Judgment: Judgment normal.     Back Exam   Range of Motion  Extension:  abnormal  Flexion: abnormal  Lateral bend right: abnormal  Lateral bend left: abnormal  Rotation right: abnormal  Rotation left: abnormal   Muscle Strength  Right Quadriceps:  5/5  Left Quadriceps:  5/5  Right Hamstrings:  5/5  Left Hamstrings:  5/5   Tests  Straight leg raise right: positive Straight leg raise left: positive  Other  Toe walk: normal Heel walk: normal Sensation: normal Gait: normal   Comments:  Left EHL is 4/5 and he has pain with straight leg raise either side.       Specialty Comments:  No specialty comments available.  Imaging: No results found.   PMFS History: Patient Active Problem List   Diagnosis Date Noted  . Spondylosis without myelopathy or radiculopathy, cervical region 06/22/2019    Priority: Medium    Class: Chronic  . Herniation of cervical intervertebral disc with radiculopathy 06/22/2019  . Status post cervical spinal fusion 06/22/2019  . Encounter for screening colonoscopy   . Polyp of sigmoid colon   . Synovitis and tenosynovitis 01/06/2019  . Strain of peroneal tendon 12/23/2018  . Closed dislocation of ankle 04/29/2018  . Elevated blood pressure reading 09/02/2017  . Neuropathy of right ankle 09/02/2017  . Obesity (BMI 35.0-39.9 without comorbidity) 09/02/2017  . Injury of nerve of lower extremity 09/02/2017  . Closed bimalleolar fracture 09/26/2016  . Loose body in ankle and foot joint 09/26/2016  . Sprain of deltoid ligament of ankle 09/26/2016   Past Medical History:  Diagnosis Date  . Anxiety   . Arm numbness left    cervical disc issues  . Arthritis    right leg  . GERD (gastroesophageal reflux disease)   . Motion sickness    ocean boats  . Neuromuscular disorder ([place])   . Neuropathy   . Obesity (BMI 35.0-39.9 without comorbidity) 09/02/2017    Family History  Adopted: Yes    Past Surgical History:  Procedure Laterality Date  . ANTERIOR CERVICAL DECOMP/DISCECTOMY FUSION N/A 06/22/2019   Procedure:  ANTERIOR CERVICAL DISCECTOMY FUSION C5-6 with plates, screws, allograft bone graft, local bone graft and Vivigen;  Surgeon: Jessy Oto, MD;  Location: [facility];  Service: Orthopedics;  Laterality: N/A;  . COLONOSCOPY WITH PROPOFOL N/A 04/30/2019   Procedure: COLONOSCOPY WITH BIOPSY;  Surgeon: Lucilla Lame, MD;  Location: [facility];  Service: Endoscopy;  Laterality: N/A;  . LEG SURGERY Right 2017, 2018   fractured leg and ankle- has screws and pins  . POLYPECTOMY N/A 04/30/2019   Procedure: POLYPECTOMY;  Surgeon: Lucilla Lame, MD;  Location: [facility];  Service: Endoscopy;  Laterality: N/A;  . SHOULDER SURGERY Left 2013   labrial tear  . WISDOM TOOTH EXTRACTION  Social History   Occupational History  . Not on file  Tobacco Use  . Smoking status: Never Smoker  . Smokeless tobacco: Never Used  Vaping Use  . Vaping Use: Never used  Substance and Sexual Activity  . Alcohol use: Yes    Alcohol/week: 1.0 standard drink    Types: 1 Cans of beer per week    Comment: occasional  . Drug use: Never  . Sexual activity: Yes

## 2021-01-20 NOTE — Patient Instructions (Signed)
Assessment & Plan: Visit Diagnoses: No diagnosis found.  Plan: Avoid bending, stooping and avoid lifting weights greater than 10 lbs. Avoid prolong standing and walking. Order for a new walker with wheels. Surgery scheduling secretary Kandice Hams, will call you in the next week to schedule for surgery.  Surgery recommended is a bilateral partial hemilaminectomy at L4-5 this with a microscope and smaller incision. Gabapentin and celebrex for pain. Risk of surgery includes risk of infection 1 in 200 patients, bleeding 1/2% chance you would need a transfusion.   Risk to the nerves is one in 10,000.Marland Kitchen Expect improved walking and standing tolerance. Expect relief of leg pain but numbness may persist depending on the length and degree of pressure that has been present.

## 2021-02-08 ENCOUNTER — Encounter: Payer: Self-pay | Admitting: Family Medicine

## 2021-02-08 ENCOUNTER — Other Ambulatory Visit: Payer: Self-pay

## 2021-02-08 ENCOUNTER — Ambulatory Visit (INDEPENDENT_AMBULATORY_CARE_PROVIDER_SITE_OTHER): Payer: Medicare Other | Admitting: Family Medicine

## 2021-02-08 VITALS — BP 130/80 | HR 72 | Ht 71.0 in | Wt 276.0 lb

## 2021-02-08 DIAGNOSIS — E669 Obesity, unspecified: Secondary | ICD-10-CM

## 2021-02-08 DIAGNOSIS — L910 Hypertrophic scar: Secondary | ICD-10-CM | POA: Diagnosis not present

## 2021-02-08 DIAGNOSIS — R030 Elevated blood-pressure reading, without diagnosis of hypertension: Secondary | ICD-10-CM | POA: Diagnosis not present

## 2021-02-08 DIAGNOSIS — Z0000 Encounter for general adult medical examination without abnormal findings: Secondary | ICD-10-CM | POA: Diagnosis not present

## 2021-02-08 DIAGNOSIS — R351 Nocturia: Secondary | ICD-10-CM

## 2021-02-08 LAB — HEMOCCULT GUIAC POC 1CARD (OFFICE): Fecal Occult Blood, POC: NEGATIVE

## 2021-02-08 NOTE — Patient Instructions (Signed)
Calorie Counting for Weight Loss Calories are units of energy. Your body needs a certain number of calories from food to keep going throughout the day. When you eat or drink more calories than your body needs, your body stores the extra calories mostly as fat. When you eat or drink fewer calories than your body needs, your body burns fat to get the energy it needs. Calorie counting means keeping track of how many calories you eat and drink each day. Calorie counting can be helpful if you need to lose weight. If you eat fewer calories than your body needs, you should lose weight. Ask your health care provider what a healthy weight is for you. For calorie counting to work, you will need to eat the right number of calories each day to lose a healthy amount of weight per week. A dietitian can help you figure out how many calories you need in a day and will suggest ways to reach your calorie goal.  A healthy amount of weight to lose each week is usually 1-2 lb (0.5-0.9 kg). This usually means that your daily calorie intake should be reduced by 500-750 calories.  Eating 1,200-1,500 calories a day can help most women lose weight.  Eating 1,500-1,800 calories a day can help most men lose weight. What do I need to know about calorie counting? Work with your health care provider or dietitian to determine how many calories you should get each day. To meet your daily calorie goal, you will need to:  Find out how many calories are in each food that you would like to eat. Try to do this before you eat.  Decide how much of the food you plan to eat.  Keep a food log. Do this by writing down what you ate and how many calories it had. To successfully lose weight, it is important to balance calorie counting with a healthy lifestyle that includes regular activity. Where do I find calorie information? The number of calories in a food can be found on a Nutrition Facts label. If a food does not have a Nutrition Facts  label, try to look up the calories online or ask your dietitian for help. Remember that calories are listed per serving. If you choose to have more than one serving of a food, you will have to multiply the calories per serving by the number of servings you plan to eat. For example, the label on a package of bread might say that a serving size is 1 slice and that there are 90 calories in a serving. If you eat 1 slice, you will have eaten 90 calories. If you eat 2 slices, you will have eaten 180 calories.   How do I keep a food log? After each time that you eat, record the following in your food log as soon as possible:  What you ate. Be sure to include toppings, sauces, and other extras on the food.  How much you ate. This can be measured in cups, ounces, or number of items.  How many calories were in each food and drink.  The total number of calories in the food you ate. Keep your food log near you, such as in a pocket-sized notebook or on an app or website on your mobile phone. Some programs will calculate calories for you and show you how many calories you have left to meet your daily goal. What are some portion-control tips?  Know how many calories are in a serving. This will   help you know how many servings you can have of a certain food.  Use a measuring cup to measure serving sizes. You could also try weighing out portions on a kitchen scale. With time, you will be able to estimate serving sizes for some foods.  Take time to put servings of different foods on your favorite plates or in your favorite bowls and cups so you know what a serving looks like.  Try not to eat straight from a food's packaging, such as from a bag or box. Eating straight from the package makes it hard to see how much you are eating and can lead to overeating. Put the amount you would like to eat in a cup or on a plate to make sure you are eating the right portion.  Use smaller plates, glasses, and bowls for smaller  portions and to prevent overeating.  Try not to multitask. For example, avoid watching TV or using your computer while eating. If it is time to eat, sit down at a table and enjoy your food. This will help you recognize when you are full. It will also help you be more mindful of what and how much you are eating. What are tips for following this plan? Reading food labels  Check the calorie count compared with the serving size. The serving size may be smaller than what you are used to eating.  Check the source of the calories. Try to choose foods that are high in protein, fiber, and vitamins, and low in saturated fat, trans fat, and sodium. Shopping  Read nutrition labels while you shop. This will help you make healthy decisions about which foods to buy.  Pay attention to nutrition labels for low-fat or fat-free foods. These foods sometimes have the same number of calories or more calories than the full-fat versions. They also often have added sugar, starch, or salt to make up for flavor that was removed with the fat.  Make a grocery list of lower-calorie foods and stick to it. Cooking  Try to cook your favorite foods in a healthier way. For example, try baking instead of frying.  Use low-fat dairy products. Meal planning  Use more fruits and vegetables. One-half of your plate should be fruits and vegetables.  Include lean proteins, such as chicken, turkey, and fish. Lifestyle Each week, aim to do one of the following:  150 minutes of moderate exercise, such as walking.  75 minutes of vigorous exercise, such as running. General information  Know how many calories are in the foods you eat most often. This will help you calculate calorie counts faster.  Find a way of tracking calories that works for you. Get creative. Try different apps or programs if writing down calories does not work for you. What foods should I eat?  Eat nutritious foods. It is better to have a nutritious,  high-calorie food, such as an avocado, than a food with few nutrients, such as a bag of potato chips.  Use your calories on foods and drinks that will fill you up and will not leave you hungry soon after eating. ? Examples of foods that fill you up are nuts and nut butters, vegetables, lean proteins, and high-fiber foods such as whole grains. High-fiber foods are foods with more than 5 g of fiber per serving.  Pay attention to calories in drinks. Low-calorie drinks include water and unsweetened drinks. The items listed above may not be a complete list of foods and beverages you can eat.   Contact a dietitian for more information.   What foods should I limit? Limit foods or drinks that are not good sources of vitamins, minerals, or protein or that are high in unhealthy fats. These include:  Candy.  Other sweets.  Sodas, specialty coffee drinks, alcohol, and juice. The items listed above may not be a complete list of foods and beverages you should avoid. Contact a dietitian for more information. How do I count calories when eating out?  Pay attention to portions. Often, portions are much larger when eating out. Try these tips to keep portions smaller: ? Consider sharing a meal instead of getting your own. ? If you get your own meal, eat only half of it. Before you start eating, ask for a container and put half of your meal into it. ? When available, consider ordering smaller portions from the menu instead of full portions.  Pay attention to your food and drink choices. Knowing the way food is cooked and what is included with the meal can help you eat fewer calories. ? If calories are listed on the menu, choose the lower-calorie options. ? Choose dishes that include vegetables, fruits, whole grains, low-fat dairy products, and lean proteins. ? Choose items that are boiled, broiled, grilled, or steamed. Avoid items that are buttered, battered, fried, or served with cream sauce. Items labeled as  crispy are usually fried, unless stated otherwise. ? Choose water, low-fat milk, unsweetened iced tea, or other drinks without added sugar. If you want an alcoholic beverage, choose a lower-calorie option, such as a glass of wine or light beer. ? Ask for dressings, sauces, and syrups on the side. These are usually high in calories, so you should limit the amount you eat. ? If you want a salad, choose a garden salad and ask for grilled meats. Avoid extra toppings such as bacon, cheese, or fried items. Ask for the dressing on the side, or ask for olive oil and vinegar or lemon to use as dressing.  Estimate how many servings of a food you are given. Knowing serving sizes will help you be aware of how much food you are eating at restaurants. Where to find more information  Centers for Disease Control and Prevention: www.cdc.gov  [place] Department of Agriculture: myplate.gov Summary  Calorie counting means keeping track of how many calories you eat and drink each day. If you eat fewer calories than your body needs, you should lose weight.  A healthy amount of weight to lose per week is usually 1-2 lb (0.5-0.9 kg). This usually means reducing your daily calorie intake by 500-750 calories.  The number of calories in a food can be found on a Nutrition Facts label. If a food does not have a Nutrition Facts label, try to look up the calories online or ask your dietitian for help.  Use smaller plates, glasses, and bowls for smaller portions and to prevent overeating.  Use your calories on foods and drinks that will fill you up and not leave you hungry shortly after a meal. This information is not intended to replace advice given to you by your health care provider. Make sure you discuss any questions you have with your health care provider. Document Revised: 12/10/2019 Document Reviewed: 12/10/2019 [employer]vier Patient Education  2021 [employer]  

## 2021-02-08 NOTE — Progress Notes (Signed)
Date:  02/08/2021   Name:  Adam Hernandez   DOB:  1966/09/16   MRN:  [id number]   Chief Complaint: Annual Exam  Patient is a 55 year old male who presents for a comprehensive physical exam. The patient reports the following problems: up to date.Marland Kitchen Health maintenance has been reviewed up to date.   Lab Results  Component Value Date   CREATININE 1.32 (H) 08/23/2020   BUN 21 08/23/2020   NA 139 08/23/2020   K 4.7 08/23/2020   CL 100 08/23/2020   CO2 26 08/23/2020   Lab Results  Component Value Date   CHOL 179 10/18/2020   HDL 42 10/18/2020   LDLCALC 110 (H) 10/18/2020   TRIG 154 (H) 10/18/2020   CHOLHDL 6.1 (H) 03/24/2019   No results found for: TSH Lab Results  Component Value Date   HGBA1C 6.4 (H) 10/18/2020   Lab Results  Component Value Date   WBC 7.0 02/02/2020   HGB 14.8 02/02/2020   HCT 44.4 02/02/2020   MCV 88 02/02/2020   PLT 283 02/02/2020   Lab Results  Component Value Date   ALT 20 10/18/2020   AST 14 10/18/2020   ALKPHOS 97 10/18/2020   BILITOT 0.5 10/18/2020     Review of Systems  Constitutional: Negative for chills and fever.  HENT: Negative for drooling, ear discharge, ear pain and sore throat.   Respiratory: Negative for cough, shortness of breath and wheezing.   Cardiovascular: Negative for chest pain, palpitations and leg swelling.  Gastrointestinal: Negative for abdominal pain, blood in stool, constipation, diarrhea and nausea.  Endocrine: Negative for cold intolerance and polydipsia.  Genitourinary: Negative for dysuria, frequency, hematuria and urgency.  Musculoskeletal: Negative for back pain, myalgias and neck pain.  Skin: Negative for rash.  Allergic/Immunologic: Negative for environmental allergies.  Neurological: Negative for dizziness and headaches.  Hematological: Does not bruise/bleed easily.  Psychiatric/Behavioral: Negative for suicidal ideas. The patient is not nervous/anxious.     Patient Active Problem List    Diagnosis Date Noted  . Herniation of cervical intervertebral disc with radiculopathy 06/22/2019  . Spondylosis without myelopathy or radiculopathy, cervical region 06/22/2019    Class: Chronic  . Status post cervical spinal fusion 06/22/2019  . Encounter for screening colonoscopy   . Polyp of sigmoid colon   . Synovitis and tenosynovitis 01/06/2019  . Strain of peroneal tendon 12/23/2018  . Closed dislocation of ankle 04/29/2018  . Elevated blood pressure reading 09/02/2017  . Neuropathy of right ankle 09/02/2017  . Obesity (BMI 35.0-39.9 without comorbidity) 09/02/2017  . Injury of nerve of lower extremity 09/02/2017  . Closed bimalleolar fracture 09/26/2016  . Loose body in ankle and foot joint 09/26/2016  . Sprain of deltoid ligament of ankle 09/26/2016    No Known Allergies  Past Surgical History:  Procedure Laterality Date  . ANTERIOR CERVICAL DECOMP/DISCECTOMY FUSION N/A 06/22/2019   Procedure: ANTERIOR CERVICAL DISCECTOMY FUSION C5-6 with plates, screws, allograft bone graft, local bone graft and Vivigen;  Surgeon: Jessy Oto, MD;  Location: [place];  Service: Orthopedics;  Laterality: N/A;  . COLONOSCOPY WITH PROPOFOL N/A 04/30/2019   Procedure: COLONOSCOPY WITH BIOPSY;  Surgeon: Lucilla Lame, MD;  Location: [place];  Service: Endoscopy;  Laterality: N/A;  . LEG SURGERY Right 2017, 2018   fractured leg and ankle- has screws and pins  . POLYPECTOMY N/A 04/30/2019   Procedure: POLYPECTOMY;  Surgeon: Lucilla Lame, MD;  Location: [place];  Service: Endoscopy;  Laterality: N/A;  . SHOULDER SURGERY Left 2013   labrial tear  . WISDOM TOOTH EXTRACTION      Social History   Tobacco Use  . Smoking status: Never Smoker  . Smokeless tobacco: Never Used  Vaping Use  . Vaping Use: Never used  Substance Use Topics  . Alcohol use: Yes    Alcohol/week: 1.0 standard drink    Types: 1 Cans of beer per week    Comment: occasional  . Drug use: Never      Medication list has been reviewed and updated.  Current Meds  Medication Sig  . atorvastatin (LIPITOR) 10 MG tablet Take 1 tablet (10 mg total) by mouth daily.  . celecoxib (CELEBREX) 200 MG capsule Take 1 capsule (200 mg total) by mouth daily.  Marland Kitchen gabapentin (NEURONTIN) 800 MG tablet Take 800 mg by mouth 3 (three) times daily. As directed by pain management  . lisinopril (ZESTRIL) 10 MG tablet Take 1 tablet (10 mg total) by mouth daily.    PHQ 2/9 Scores 02/08/2021 08/23/2020 03/01/2020 02/02/2020  PHQ - 2 Score 0 0 0 0  PHQ- 9 Score 0 0 0 2    GAD 7 : Generalized Anxiety Score 02/08/2021 08/23/2020 03/01/2020 02/02/2020  Nervous, Anxious, on Edge 0 0 0 0  Control/stop worrying 0 0 0 0  Worry too much - different things 0 0 0 0  Trouble relaxing 0 0 0 0  Restless 0 0 0 0  Easily annoyed or irritable 0 0 0 2  Afraid - awful might happen 0 0 0 0  Total GAD 7 Score 0 0 0 2  Anxiety Difficulty - - - Not difficult at all    BP Readings from Last 3 Encounters:  02/08/21 130/80  01/20/21 (!) 142/86  12/23/20 137/89    Physical Exam Vitals and nursing note reviewed.  Constitutional:      Appearance: Normal appearance. He is overweight.  HENT:     Head: Normocephalic.     Jaw: There is normal jaw occlusion.     Right Ear: Hearing, tympanic membrane, ear canal and external ear normal.     Left Ear: Hearing, tympanic membrane, ear canal and external ear normal.     Nose: Nose normal.     Mouth/Throat:     Lips: Pink. No lesions.     Mouth: Mucous membranes are moist.     Dentition: Normal dentition.     Tongue: No lesions.     Palate: No mass.     Pharynx: Oropharynx is clear. Uvula midline.  Eyes:     General: Lids are normal. Vision grossly intact. Gaze aligned appropriately. No scleral icterus.       Right eye: No discharge.        Left eye: No discharge.     Conjunctiva/sclera: Conjunctivae normal.     Pupils: Pupils are equal, round, and reactive to light.      Funduscopic exam:    Right eye: Red reflex present.        Left eye: Red reflex present. Neck:     Thyroid: No thyromegaly.     Vascular: No JVD.     Trachea: No tracheal deviation.  Cardiovascular:     Rate and Rhythm: Normal rate and regular rhythm.     Chest Wall: PMI is not displaced.     Pulses:          Carotid pulses are 2+ on the right side and 2+ on the  left side.      Radial pulses are 2+ on the right side and 2+ on the left side.       Femoral pulses are 2+ on the right side and 2+ on the left side.      Popliteal pulses are 2+ on the right side and 2+ on the left side.       Dorsalis pedis pulses are 2+ on the right side and 2+ on the left side.       Posterior tibial pulses are 2+ on the right side and 2+ on the left side.     Heart sounds: Normal heart sounds, S1 normal and S2 normal. No murmur heard.  No systolic murmur is present.  No diastolic murmur is present. No friction rub. No gallop. No S3 sounds.   Pulmonary:     Effort: Pulmonary effort is normal. No respiratory distress.     Breath sounds: Normal breath sounds and air entry. No decreased air movement. No decreased breath sounds, wheezing, rhonchi or rales.  Chest:     Chest wall: No mass.  Breasts:     Right: Normal. No axillary adenopathy or supraclavicular adenopathy.     Left: Normal. No axillary adenopathy or supraclavicular adenopathy.    Abdominal:     General: Bowel sounds are normal.     Palpations: Abdomen is soft. There is no hepatomegaly, splenomegaly or mass.     Tenderness: There is no abdominal tenderness. There is no guarding or rebound.     Hernia: No hernia is present. There is no hernia in the umbilical area, ventral area, left inguinal area or right inguinal area.  Genitourinary:    Penis: Normal.      Testes: Normal.        Right: Mass not present.        Left: Mass not present.     Epididymis:     Right: Normal.     Left: Normal.     Prostate: Normal. Not enlarged, not tender  and no nodules present.     Rectum: Normal. Guaiac result negative. No mass.  Musculoskeletal:        General: No tenderness. Normal range of motion.     Cervical back: Full passive range of motion without pain, normal range of motion and neck supple.     Right lower leg: No edema.     Left lower leg: No edema.  Lymphadenopathy:     Head:     Right side of head: No submental, submandibular or tonsillar adenopathy.     Left side of head: No submental, submandibular or tonsillar adenopathy.     Cervical: No cervical adenopathy.     Right cervical: No superficial or deep cervical adenopathy.    Left cervical: No superficial or deep cervical adenopathy.     Upper Body:     Right upper body: No supraclavicular or axillary adenopathy.     Left upper body: No supraclavicular or axillary adenopathy.     Lower Body: No right inguinal adenopathy. No left inguinal adenopathy.  Skin:    General: Skin is warm.     Capillary Refill: Capillary refill takes less than 2 seconds.     Findings: No rash.  Neurological:     Mental Status: He is alert and oriented to person, place, and time.     Cranial Nerves: Cranial nerves are intact. No cranial nerve deficit.     Sensory: Sensation is intact.  Motor: Motor function is intact.     Deep Tendon Reflexes: Reflexes are normal and symmetric.     Reflex Scores:      Tricep reflexes are 2+ on the right side and 2+ on the left side.      Bicep reflexes are 2+ on the right side and 2+ on the left side.      Brachioradialis reflexes are 2+ on the right side and 2+ on the left side.      Patellar reflexes are 2+ on the right side and 2+ on the left side.      Achilles reflexes are 2+ on the right side and 2+ on the left side. Psychiatric:        Attention and Perception: Attention and perception normal.        Mood and Affect: Mood and affect normal.        Behavior: Behavior is cooperative.     Wt Readings from Last 3 Encounters:  02/08/21 276 lb  (125.2 kg)  01/20/21 271 lb (122.9 kg)  12/23/20 271 lb (122.9 kg)    BP 130/80   Pulse 72   Ht 5\' 11"  (1.803 m)   Wt 276 lb (125.2 kg)   BMI 38.49 kg/m   Assessment and Plan: 1. Annual physical exam Patient's chart was reviewed for previous encounters most recent labs most recent imaging and care everywhere.  There was no subjective/objective concerns noted during history and physical exam otherwise noted.Zaylon Bossier Sullivant is a 55 y.o. male who presents today for his Complete Annual Exam. He feels well. He reports exercising occasional. He reports he is sleeping well. Immunizations are reviewed and recommendations provided.   Age appropriate screening tests are discussed. Counseling given for risk factor reduction interventions.  We will obtain renal function panel at this time.Health risks of being over weight were discussed and patient was counseled on weight loss options and exercise. - Renal Function Panel - POCT Occult Blood Stool  2. Elevated blood pressure reading Blood pressures been elevated in the past patient has been instructed to reduce sodium intake  3. Obesity (BMI 35.0-39.9 without comorbidity) Health risks of being over weight were discussed and patient was counseled on weight loss options and exercise.  4. Keloid Patient has a keloid on his back that was discussed with his concern that this is not an lesion but  secondary to an excision probably sebaceous cyst in the past.  This is unlikely to go away with excision and does not pose a significant threat.  Patient agrees to watch at this time.  5. Nocturia Patient has mild nocturia and will check PSA. - PSA

## 2021-02-09 ENCOUNTER — Encounter: Payer: Self-pay | Admitting: Family Medicine

## 2021-02-09 LAB — RENAL FUNCTION PANEL
Albumin: 4.6 g/dL (ref 3.8–4.9)
BUN/Creatinine Ratio: 16 (ref 9–20)
BUN: 19 mg/dL (ref 6–24)
CO2: 22 mmol/L (ref 20–29)
Calcium: 9 mg/dL (ref 8.7–10.2)
Chloride: 98 mmol/L (ref 96–106)
Creatinine, Ser: 1.17 mg/dL (ref 0.76–1.27)
Glucose: 105 mg/dL — ABNORMAL HIGH (ref 65–99)
Phosphorus: 3.7 mg/dL (ref 2.8–4.1)
Potassium: 4.5 mmol/L (ref 3.5–5.2)
Sodium: 138 mmol/L (ref 134–144)
eGFR: 75 mL/min/{1.73_m2} (ref >59)

## 2021-02-09 LAB — PSA: Prostate Specific Ag, Serum: 1.1 ng/mL (ref 0.0–4.0)

## 2021-02-17 ENCOUNTER — Ambulatory Visit: Payer: Medicare Other | Admitting: Specialist

## 2021-02-17 ENCOUNTER — Other Ambulatory Visit: Payer: Self-pay

## 2021-02-17 ENCOUNTER — Encounter: Payer: Self-pay | Admitting: Specialist

## 2021-02-17 VITALS — BP 123/67 | HR 69 | Ht 71.0 in | Wt 276.0 lb

## 2021-02-17 DIAGNOSIS — M4807 Spinal stenosis, lumbosacral region: Secondary | ICD-10-CM | POA: Diagnosis not present

## 2021-02-17 DIAGNOSIS — M5416 Radiculopathy, lumbar region: Secondary | ICD-10-CM | POA: Diagnosis not present

## 2021-02-17 DIAGNOSIS — M25571 Pain in right ankle and joints of right foot: Secondary | ICD-10-CM

## 2021-02-17 DIAGNOSIS — G589 Mononeuropathy, unspecified: Secondary | ICD-10-CM

## 2021-02-17 DIAGNOSIS — Z8781 Personal history of (healed) traumatic fracture: Secondary | ICD-10-CM

## 2021-02-17 DIAGNOSIS — M47812 Spondylosis without myelopathy or radiculopathy, cervical region: Secondary | ICD-10-CM | POA: Diagnosis not present

## 2021-02-17 DIAGNOSIS — G8929 Other chronic pain: Secondary | ICD-10-CM

## 2021-02-17 NOTE — Patient Instructions (Signed)
Plan:Avoid bending, stooping and avoid lifting weights greater than 10 lbs. Avoid prolong standing and walking. Order for a new walker with wheels. Surgery scheduling secretary Kandice Hams, will call you in the next week to schedule for surgery.  Surgery recommended is abilateral partial hemilaminectomy at L4-5this with a microscope and smaller incision. Gabapentin and celebrex for pain.Risk of surgery includes risk of infection 1 in 200 patients, bleeding 1/2% chance you would need a transfusion. Risk to the nerves is one in 10,000.Marland Kitchen Expect improved walking and standing tolerance. Expect relief of leg pain but numbness may persist depending on the length and degree of pressure that has been present.  Surgery L4-5 for lateral recess stenosis.

## 2021-02-17 NOTE — Progress Notes (Signed)
Office Visit Note   Patient: Adam Hernandez           Date of Birth: 1966-10-02           MRN: [id number] Visit Date: 02/17/2021              Requested by: Juline Patch, MD [address] [place],  [place] [address] PCP: Juline Patch, MD   Assessment & Plan: Visit Diagnoses:  1. Spinal stenosis of lumbosacral region   2. Spondylosis without myelopathy or radiculopathy, cervical region   3. Radiculopathy, lumbar region   4. Chronic pain of right ankle   5. Mononeuropathy   6. History of fracture of right ankle     Plan:Avoid bending, stooping and avoid lifting weights greater than 10 lbs. Avoid prolong standing and walking. Order for a new walker with wheels. Surgery scheduling secretary Kandice Hams, will call you in the next week to schedule for surgery.  Surgery recommended is abilateral partial hemilaminectomy at L4-5this with a microscope and smaller incision. Gabapentin and celebrex for pain.Risk of surgery includes risk of infection 1 in 200 patients, bleeding 1/2% chance you would need a transfusion. Risk to the nerves is one in 10,000.Marland Kitchen Expect improved walking and standing tolerance. Expect relief of leg pain but numbness may persist depending on the length and degree of pressure that has been present.  Surgery L4-5 for lateral recess stenosis.    Follow-Up Instructions: Return in about 6 weeks (around 03/31/2021).   Orders:  Orders Placed This Encounter  Procedures  . Ambulatory referral to Orthopedic Surgery   No orders of the defined types were placed in this encounter.     Procedures: No procedures performed   Clinical Data: No additional findings.   Subjective: Chief Complaint  Patient presents with  . Neck - Pain, Follow-up  . Lower Back - Pain, Follow-up    55 year old male with history of right ankle fracture and lumbar fusion L4-5. He is having increased pain into both legs left is greater than right. The pain  into the right anterolateral foot with a neuralgia associated with previous right ankle fracture and surgeons at [facility] indicate that they are Considering exploration of the right superficial peroneal nerve.     Review of Systems  Constitutional: Negative.   HENT: Negative.   Eyes: Negative.   Respiratory: Negative.   Cardiovascular: Negative.   Gastrointestinal: Negative.   Endocrine: Negative.   Genitourinary: Negative.   Musculoskeletal: Negative.   Skin: Negative.   Allergic/Immunologic: Negative.   Neurological: Negative.   Hematological: Negative.   Psychiatric/Behavioral: Negative.      Objective: Vital Signs: BP 123/67   Pulse 69   Ht 5\' 11"  (1.803 m)   Wt 276 lb (125.2 kg)   BMI 38.49 kg/m   Physical Exam Constitutional:      Appearance: He is well-developed.  HENT:     Head: Normocephalic and atraumatic.  Eyes:     Pupils: Pupils are equal, round, and reactive to light.  Pulmonary:     Effort: Pulmonary effort is normal.     Breath sounds: Normal breath sounds.  Abdominal:     General: Bowel sounds are normal.     Palpations: Abdomen is soft.  Musculoskeletal:        General: Normal range of motion.     Cervical back: Normal range of motion and neck supple.     Lumbar back: Negative right straight leg raise test  and negative left straight leg raise test.  Skin:    General: Skin is warm and dry.  Neurological:     Mental Status: He is alert and oriented to person, place, and time.  Psychiatric:        Behavior: Behavior normal.        Thought Content: Thought content normal.        Judgment: Judgment normal.     Back Exam   Range of Motion  Extension: normal  Flexion: normal  Lateral bend right: normal  Lateral bend left: normal  Rotation right: normal  Rotation left: normal   Muscle Strength  Right Quadriceps:  5/5  Left Quadriceps:  5/5  Right Hamstrings:  5/5  Left Hamstrings:  5/5   Tests  Straight leg raise right:  negative Straight leg raise left: negative  Reflexes  Patellar: 0/4 Achilles: 0/4  Other  Toe walk: abnormal Heel walk: abnormal Sensation: normal Erythema: no back redness Scars: absent  Comments:  Weak right knee extension and right foot DF 4/5 and left knee with 5-/5 strength.       Specialty Comments:  No specialty comments available.  Imaging: No results found.   PMFS History: Patient Active Problem List   Diagnosis Date Noted  . Spondylosis without myelopathy or radiculopathy, cervical region 06/22/2019    Priority: Medium    Class: Chronic  . Herniation of cervical intervertebral disc with radiculopathy 06/22/2019  . Status post cervical spinal fusion 06/22/2019  . Encounter for screening colonoscopy   . Polyp of sigmoid colon   . Synovitis and tenosynovitis 01/06/2019  . Strain of peroneal tendon 12/23/2018  . Closed dislocation of ankle 04/29/2018  . Elevated blood pressure reading 09/02/2017  . Neuropathy of right ankle 09/02/2017  . Obesity (BMI 35.0-39.9 without comorbidity) 09/02/2017  . Injury of nerve of lower extremity 09/02/2017  . Closed bimalleolar fracture 09/26/2016  . Loose body in ankle and foot joint 09/26/2016  . Sprain of deltoid ligament of ankle 09/26/2016   Past Medical History:  Diagnosis Date  . Anxiety   . Arm numbness left    cervical disc issues  . Arthritis    right leg  . GERD (gastroesophageal reflux disease)   . Motion sickness    ocean boats  . Neuromuscular disorder ([place])   . Neuropathy   . Obesity (BMI 35.0-39.9 without comorbidity) 09/02/2017    Family History  Adopted: Yes    Past Surgical History:  Procedure Laterality Date  . ANTERIOR CERVICAL DECOMP/DISCECTOMY FUSION N/A 06/22/2019   Procedure: ANTERIOR CERVICAL DISCECTOMY FUSION C5-6 with plates, screws, allograft bone graft, local bone graft and Vivigen;  Surgeon: Jessy Oto, MD;  Location: [place];  Service: Orthopedics;  Laterality: N/A;  .  COLONOSCOPY WITH PROPOFOL N/A 04/30/2019   Procedure: COLONOSCOPY WITH BIOPSY;  Surgeon: Lucilla Lame, MD;  Location: [place];  Service: Endoscopy;  Laterality: N/A;  . LEG SURGERY Right 2017, 2018   fractured leg and ankle- has screws and pins  . POLYPECTOMY N/A 04/30/2019   Procedure: POLYPECTOMY;  Surgeon: Lucilla Lame, MD;  Location: [place];  Service: Endoscopy;  Laterality: N/A;  . SHOULDER SURGERY Left 2013   labrial tear  . WISDOM TOOTH EXTRACTION     Social History   Occupational History  . Not on file  Tobacco Use  . Smoking status: Never Smoker  . Smokeless tobacco: Never Used  Vaping Use  . Vaping Use: Never used  Substance and  Sexual Activity  . Alcohol use: Yes    Alcohol/week: 1.0 standard drink    Types: 1 Cans of beer per week    Comment: occasional  . Drug use: Never  . Sexual activity: Yes

## 2021-02-28 ENCOUNTER — Encounter: Payer: Self-pay | Admitting: Specialist

## 2021-03-02 ENCOUNTER — Other Ambulatory Visit: Payer: Self-pay

## 2021-03-15 ENCOUNTER — Ambulatory Visit: Payer: Medicare Other | Admitting: Surgery

## 2021-03-15 NOTE — Progress Notes (Signed)
Surgical Instructions    Your procedure is scheduled on May 5  Report to [facility] Main Entrance "A" at 0530 A.M., then check in with the Admitting office.  Call this number if you have problems the morning of surgery:  [phone]   If you have any questions prior to your surgery date call [phone]: Open Monday-Friday 8am-4pm    Remember:  Do not eat after midnight the night before your surgery  You may drink clear liquids until 0430 am the morning of your surgery.   Clear liquids allowed are: Water, Non-Citrus Juices (without pulp), Carbonated Beverages, Clear Tea, Black Coffee Only, and Gatorade  Please complete your PRE-SURGERY ENSURE that was provided to you by 0530 am the morning of surgery.  Please, if able, drink it in one setting. DO NOT SIP.     Take these medicines the morning of surgery with A SIP OF WATER  atorvastatin (LIPITOR) gabapentin (NEURONTIN)   As of today, STOP taking any Aspirin (unless otherwise instructed by your surgeon) Aleve, Naproxen, Ibuprofen, Motrin, Advil, Goody's, BC's, all herbal medications, fish oil, and all vitamins. celecoxib (CELEBREX                     Do not wear jewelry            Do not wear lotions, powders, colognes, or deodorant.            Men may shave face and neck.            Do not bring valuables to the hospital.            [facility] is not responsible for any belongings or valuables.  Do NOT Smoke (Tobacco/Vaping) or drink Alcohol 24 hours prior to your procedure If you use a CPAP at night, you may bring all equipment for your overnight stay.   Contacts, glasses, dentures or bridgework may not be worn into surgery, please bring cases for these belongings   For patients admitted to the hospital, discharge time will be determined by your treatment team.   Patients discharged the day of surgery will not be allowed to drive home, and someone needs to stay with them for 24 hours.    Special instructions:   [facility]- Preparing For Surgery  Before surgery, you can play an important role. Because skin is not sterile, your skin needs to be as free of germs as possible. You can reduce the number of germs on your skin by washing with CHG (chlorahexidine gluconate) Soap before surgery.  CHG is an antiseptic cleaner which kills germs and bonds with the skin to continue killing germs even after washing.    Oral Hygiene is also important to reduce your risk of infection.  Remember - BRUSH YOUR TEETH THE MORNING OF SURGERY WITH YOUR REGULAR TOOTHPASTE  Please do not use if you have an allergy to CHG or antibacterial soaps. If your skin becomes reddened/irritated stop using the CHG.  Do not shave (including legs and underarms) for at least 48 hours prior to first CHG shower. It is OK to shave your face.  Please follow these instructions carefully.   1. Shower the NIGHT BEFORE SURGERY and the MORNING OF SURGERY  2. If you chose to wash your hair, wash your hair first as usual with your normal shampoo.  3. After you shampoo, rinse your hair and body thoroughly to remove the shampoo.  4. Wash Face and genitals (private parts) with  your normal soap.   5.  Shower the NIGHT BEFORE SURGERY and the MORNING OF SURGERY with CHG Soap.   6. Use CHG Soap as you would any other liquid soap. You can apply CHG directly to the skin and wash gently with a scrungie or a clean washcloth.   7. Apply the CHG Soap to your body ONLY FROM THE NECK DOWN.  Do not use on open wounds or open sores. Avoid contact with your eyes, ears, mouth and genitals (private parts). Wash Face and genitals (private parts)  with your normal soap.   8. Wash thoroughly, paying special attention to the area where your surgery will be performed.  9. Thoroughly rinse your body with warm water from the neck down.  10. DO NOT shower/wash with your normal soap after using and rinsing off the CHG Soap.  11. Pat yourself dry with a CLEAN  TOWEL.  12. Wear CLEAN PAJAMAS to bed the night before surgery  13. Place CLEAN SHEETS on your bed the night before your surgery  14. DO NOT SLEEP WITH PETS.   Day of Surgery: Take a shower with CHG soap. Wear Clean/Comfortable clothing the morning of surgery Do not apply any deodorants/lotions.   Remember to brush your teeth WITH YOUR REGULAR TOOTHPASTE.   Please read over the following fact sheets that you were given.

## 2021-03-16 ENCOUNTER — Encounter: Payer: Self-pay | Admitting: Surgery

## 2021-03-16 ENCOUNTER — Encounter (HOSPITAL_COMMUNITY)
Admission: RE | Admit: 2021-03-16 | Discharge: 2021-03-16 | Disposition: A | Discharge status: Home / Self Care | Payer: Medicare Other | Source: Ambulatory Visit | Attending: Specialist | Admitting: Specialist

## 2021-03-16 ENCOUNTER — Other Ambulatory Visit: Payer: Self-pay

## 2021-03-16 ENCOUNTER — Encounter (HOSPITAL_COMMUNITY): Payer: Self-pay

## 2021-03-16 ENCOUNTER — Ambulatory Visit (INDEPENDENT_AMBULATORY_CARE_PROVIDER_SITE_OTHER): Payer: Medicare Other | Admitting: Surgery

## 2021-03-16 VITALS — BP 133/81 | HR 62 | Ht 71.0 in | Wt 276.0 lb

## 2021-03-16 DIAGNOSIS — M50822 Other cervical disc disorders at C5-C6 level: Secondary | ICD-10-CM | POA: Insufficient documentation

## 2021-03-16 DIAGNOSIS — E669 Obesity, unspecified: Secondary | ICD-10-CM | POA: Diagnosis not present

## 2021-03-16 DIAGNOSIS — Z01818 Encounter for other preprocedural examination: Secondary | ICD-10-CM | POA: Insufficient documentation

## 2021-03-16 DIAGNOSIS — Z79899 Other long term (current) drug therapy: Secondary | ICD-10-CM | POA: Insufficient documentation

## 2021-03-16 DIAGNOSIS — M4807 Spinal stenosis, lumbosacral region: Secondary | ICD-10-CM

## 2021-03-16 DIAGNOSIS — I10 Essential (primary) hypertension: Secondary | ICD-10-CM | POA: Insufficient documentation

## 2021-03-16 DIAGNOSIS — K219 Gastro-esophageal reflux disease without esophagitis: Secondary | ICD-10-CM | POA: Diagnosis not present

## 2021-03-16 DIAGNOSIS — Z20822 Contact with and (suspected) exposure to covid-19: Secondary | ICD-10-CM | POA: Diagnosis not present

## 2021-03-16 DIAGNOSIS — M48061 Spinal stenosis, lumbar region without neurogenic claudication: Secondary | ICD-10-CM | POA: Insufficient documentation

## 2021-03-16 DIAGNOSIS — Z6836 Body mass index (BMI) 36.0-36.9, adult: Secondary | ICD-10-CM | POA: Insufficient documentation

## 2021-03-16 DIAGNOSIS — Z981 Arthrodesis status: Secondary | ICD-10-CM | POA: Insufficient documentation

## 2021-03-16 HISTORY — DX: Essential (primary) hypertension: I10

## 2021-03-16 LAB — COMPREHENSIVE METABOLIC PANEL
ALT: 27 U/L (ref 0–44)
AST: 20 U/L (ref 15–41)
Albumin: 3.8 g/dL (ref 3.5–5.0)
Alkaline Phosphatase: 80 U/L (ref 38–126)
Anion gap: 9 (ref 5–15)
BUN: 18 mg/dL (ref 6–20)
CO2: 24 mmol/L (ref 22–32)
Calcium: 8.9 mg/dL (ref 8.9–10.3)
Chloride: 105 mmol/L (ref 98–111)
Creatinine, Ser: 1.29 mg/dL — ABNORMAL HIGH (ref 0.61–1.24)
GFR, Estimated: >60 mL/min (ref >60)
Glucose, Bld: 98 mg/dL (ref 70–99)
Potassium: 4.1 mmol/L (ref 3.5–5.1)
Sodium: 138 mmol/L (ref 135–145)
Total Bilirubin: 0.7 mg/dL (ref 0.3–1.2)
Total Protein: 7.3 g/dL (ref 6.5–8.1)

## 2021-03-16 LAB — PROTIME-INR
INR: 1.1 (ref 0.8–1.2)
Prothrombin Time: 13.7 seconds (ref 11.4–15.2)

## 2021-03-16 LAB — SURGICAL PCR SCREEN
MRSA, PCR: NEGATIVE
Staphylococcus aureus: NEGATIVE

## 2021-03-16 LAB — CBC
HCT: 44.4 % (ref 39.0–52.0)
Hemoglobin: 14.8 g/dL (ref 13.0–17.0)
MCH: 29.5 pg (ref 26.0–34.0)
MCHC: 33.3 g/dL (ref 30.0–36.0)
MCV: 88.6 fL (ref 80.0–100.0)
Platelets: 300 10*3/uL (ref 150–400)
RBC: 5.01 MIL/uL (ref 4.22–5.81)
RDW: 12.8 % (ref 11.5–15.5)
WBC: 8.6 10*3/uL (ref 4.0–10.5)
nRBC: 0 % (ref 0.0–0.2)

## 2021-03-16 LAB — SARS CORONAVIRUS 2 (TAT 6-24 HRS): SARS Coronavirus 2: NEGATIVE

## 2021-03-16 NOTE — Progress Notes (Signed)
PCP: Otilio Miu, MD Cardiologist: Denies  EKG: 03/16/21 CXR: na ECHO: denies Stress Test: denies Cardiac Cath: denies  Fasting Blood Sugar- na Checks Blood Sugar_na__ times a day  OSA/CPAP: No  ASA/Blood Thinners: NO  Covid test 03/16/21 at PAT  Anesthesia Review: No  Patient denies shortness of breath, fever, cough, and chest pain at PAT appointment.  Patient verbalized understanding of instructions provided today at the PAT appointment.  Patient asked to review instructions at home and day of surgery.

## 2021-03-16 NOTE — Progress Notes (Signed)
55 year old white male history of L4-5 stenosis comes in for preop evaluation.  States that his symptoms are unchanged from previous visit.  He is want to proceed with L4-5 decompression as scheduled.  Today history and physical performed.  Review of systems negative.  Surgical procedure discussed.  All questions answered.

## 2021-03-17 MED ORDER — CEFAZOLIN IN SODIUM CHLORIDE 3-0.9 GM/100ML-% IV SOLN
3.0000 g | INTRAVENOUS | Status: DC
  Filled 2021-03-17 (×2): qty 100

## 2021-03-17 NOTE — Progress Notes (Signed)
Anesthesia Chart Review:  Case: [id number] Date/Time: 03/20/21 0730   Procedure: BILATERAL LATERAL RECESS DECOMPRESSION/PARTIAL HEMILAMINECTOMIES L4-5 (N/A )   Anesthesia type: General   Pre-op diagnosis: bilateral L4-5 lateral recess stenosis   Location: [facility] OR ROOM 05 / [facility]   Surgeons: Jessy Oto, MD      DISCUSSION: Patient is a 55 year old male scheduled for the above procedure.  History includes never smoker, GERD, HTN, cervical disc disease (s/p C5-6 ACDF 06/22/19), obesity.  No chest pain or SOB per PAT RN interview. 03/16/2021 presurgical COVID-19 test negative.  Anesthesia team to evaluate on the day of surgery.   VS: BP 125/88   Pulse 69   Temp 37.2 C (Oral)   Resp 18   Ht 6' (1.829 m)   Wt 123.5 kg   SpO2 98%   BMI 36.92 kg/m     PROVIDERS: Juline Patch, MD is PCP. Annual exam on 02/08/21.    LABS: Labs reviewed: Acceptable for surgery. Cr 1.29, previously 1.17 on 02/08/21.  (all labs ordered are listed, but only abnormal results are displayed)  Labs Reviewed  COMPREHENSIVE METABOLIC PANEL - Abnormal; Notable for the following components:      Result Value   Creatinine, Ser 1.29 (*)    All other components within normal limits  SURGICAL PCR SCREEN  SARS CORONAVIRUS 2 (TAT 6-24 HRS)  CBC  PROTIME-INR     IMAGES: MRI L-spine 08/04/20 [facility]): Impression: 1.  Rightward extraforaminal protrusions at L2-3 and L3-4 with displacement of the corresponding nerve roots. 2.  Moderate canal stenosis at L4-5, mild to moderate at L2-3 and L3-4.  These result from mild degenerative change in combination with developmentally short pedicles. 3.  Mild to moderate foraminal narrowing, as above [see full report scanned under Results Review tab]   EKG: 03/16/21: Normal sinus rhythm Left axis deviation Incomplete right bundle branch block No old tracing to compare Confirmed by Dorris Carnes [phone]) on 03/16/2021 9:53:39 PM   CV: N/A  Past Medical  History:  Diagnosis Date  . Anxiety   . Arm numbness left    cervical disc issues  . Arthritis    right leg  . GERD (gastroesophageal reflux disease)   . Hypertension   . Motion sickness    ocean boats  . Neuromuscular disorder ([place])   . Neuropathy   . Obesity (BMI 35.0-39.9 without comorbidity) 09/02/2017    Past Surgical History:  Procedure Laterality Date  . ANTERIOR CERVICAL DECOMP/DISCECTOMY FUSION N/A 06/22/2019   Procedure: ANTERIOR CERVICAL DISCECTOMY FUSION C5-6 with plates, screws, allograft bone graft, local bone graft and Vivigen;  Surgeon: Jessy Oto, MD;  Location: [place];  Service: Orthopedics;  Laterality: N/A;  . COLONOSCOPY WITH PROPOFOL N/A 04/30/2019   Procedure: COLONOSCOPY WITH BIOPSY;  Surgeon: Lucilla Lame, MD;  Location: [place];  Service: Endoscopy;  Laterality: N/A;  . LEG SURGERY Right 2017, 2018   fractured leg and ankle- has screws and pins  . POLYPECTOMY N/A 04/30/2019   Procedure: POLYPECTOMY;  Surgeon: Lucilla Lame, MD;  Location: [place];  Service: Endoscopy;  Laterality: N/A;  . SHOULDER SURGERY Left 2013   labrial tear  . WISDOM TOOTH EXTRACTION      MEDICATIONS: . atorvastatin (LIPITOR) 10 MG tablet  . celecoxib (CELEBREX) 200 MG capsule  . gabapentin (NEURONTIN) 800 MG tablet  . lisinopril (ZESTRIL) 10 MG tablet   No current facility-administered medications for this encounter.    Myra Gianotti, PA-C Surgical  Short Stay/Anesthesiology [facility] Phone [phone] [facility] Phone [phone] 03/17/2021 10:44 AM

## 2021-03-17 NOTE — H&P (Signed)
Adam Hernandez is an 55 y.o. male.   Chief Complaint: Back pain and lower extremity radiculopathy HPI: 55 year old white male history of L4-5 stenosis comes in for preop evaluation.  States that his symptoms are unchanged from previous visit.  He is want to proceed with L4-5 decompression as scheduled.  Today history and physical performed  Past Medical History:  Diagnosis Date  . Anxiety   . Arm numbness left    cervical disc issues  . Arthritis    right leg  . GERD (gastroesophageal reflux disease)   . Hypertension   . Motion sickness    ocean boats  . Neuromuscular disorder ([place])   . Neuropathy   . Obesity (BMI 35.0-39.9 without comorbidity) 09/02/2017    Past Surgical History:  Procedure Laterality Date  . ANTERIOR CERVICAL DECOMP/DISCECTOMY FUSION N/A 06/22/2019   Procedure: ANTERIOR CERVICAL DISCECTOMY FUSION C5-6 with plates, screws, allograft bone graft, local bone graft and Vivigen;  Surgeon: Jessy Oto, MD;  Location: [place];  Service: Orthopedics;  Laterality: N/A;  . COLONOSCOPY WITH PROPOFOL N/A 04/30/2019   Procedure: COLONOSCOPY WITH BIOPSY;  Surgeon: Lucilla Lame, MD;  Location: [place];  Service: Endoscopy;  Laterality: N/A;  . LEG SURGERY Right 2017, 2018   fractured leg and ankle- has screws and pins  . POLYPECTOMY N/A 04/30/2019   Procedure: POLYPECTOMY;  Surgeon: Lucilla Lame, MD;  Location: [place];  Service: Endoscopy;  Laterality: N/A;  . SHOULDER SURGERY Left 2013   labrial tear  . WISDOM TOOTH EXTRACTION      Family History  Adopted: Yes   Social History:  reports that he has never smoked. He has never used smokeless tobacco. He reports current alcohol use of about 1.0 standard drink of alcohol per week. He reports that he does not use drugs.  Allergies: No Known Allergies  No medications prior to admission.    Results for orders placed or performed during the hospital encounter of 03/16/21 (from the past 48 hour(s))   Surgical pcr screen     Status: None   Collection Time: 03/16/21 11:21 AM   Specimen: Nasal Mucosa; Nasal Swab  Result Value Ref Range   MRSA, PCR NEGATIVE NEGATIVE   Staphylococcus aureus NEGATIVE NEGATIVE    Comment: (NOTE) The Xpert SA Assay (FDA approved for NASAL specimens in patients 29 years of age and older), is one component of a comprehensive surveillance program. It is not intended to diagnose infection nor to guide or monitor treatment. Performed at [facility], [place] [address], [place], [place] [address]   CBC     Status: None   Collection Time: 03/16/21 11:22 AM  Result Value Ref Range   WBC 8.6 4.0 - 10.5 K/uL   RBC 5.01 4.22 - 5.81 MIL/uL   Hemoglobin 14.8 13.0 - 17.0 g/dL   HCT 44.4 39.0 - 52.0 %   MCV 88.6 80.0 - 100.0 fL   MCH 29.5 26.0 - 34.0 pg   MCHC 33.3 30.0 - 36.0 g/dL   RDW 12.8 11.5 - 15.5 %   Platelets 300 150 - 400 K/uL   nRBC 0.0 0.0 - 0.2 %    Comment: Performed at [facility], [place] [address], [place], [place] [address]  Comprehensive metabolic panel     Status: Abnormal   Collection Time: 03/16/21 11:22 AM  Result Value Ref Range   Sodium 138 135 - 145 mmol/L   Potassium 4.1 3.5 - 5.1 mmol/L  Chloride 105 98 - 111 mmol/L   CO2 24 22 - 32 mmol/L   Glucose, Bld 98 70 - 99 mg/dL    Comment: Glucose reference range applies only to samples taken after fasting for at least 8 hours.   BUN 18 6 - 20 mg/dL   Creatinine, Ser 1.29 (H) 0.61 - 1.24 mg/dL   Calcium 8.9 8.9 - 10.3 mg/dL   Total Protein 7.3 6.5 - 8.1 g/dL   Albumin 3.8 3.5 - 5.0 g/dL   AST 20 15 - 41 U/L   ALT 27 0 - 44 U/L   Alkaline Phosphatase 80 38 - 126 U/L   Total Bilirubin 0.7 0.3 - 1.2 mg/dL   GFR, Estimated >60 >60 mL/min    Comment: (NOTE) Calculated using the CKD-EPI Creatinine Equation (2021)    Anion gap 9 5 - 15    Comment: Performed at [facility] [address], [place], [place] [address]  Protime-INR     Status: None   Collection Time:  03/16/21 11:22 AM  Result Value Ref Range   Prothrombin Time 13.7 11.4 - 15.2 seconds   INR 1.1 0.8 - 1.2    Comment: (NOTE) INR goal varies based on device and disease states. Performed at [facility], [place] [address], [place], [place] [address]   SARS CORONAVIRUS 2 (TAT 6-24 HRS) Nasopharyngeal Nasopharyngeal Swab     Status: None   Collection Time: 03/16/21 11:22 AM   Specimen: Nasopharyngeal Swab  Result Value Ref Range   SARS Coronavirus 2 NEGATIVE NEGATIVE    Comment: (NOTE) SARS-CoV-2 target nucleic acids are NOT DETECTED.  The SARS-CoV-2 RNA is generally detectable in upper and lower respiratory specimens during the acute phase of infection. Negative results do not preclude SARS-CoV-2 infection, do not rule out co-infections with other pathogens, and should not be used as the sole basis for treatment or other patient management decisions. Negative results must be combined with clinical observations, patient history, and epidemiological information. The expected result is Negative.  Fact Sheet for Patients: SugarRoll.be  Fact Sheet for Healthcare Providers: https://www.woods-mathews.com/  This test is not yet approved or cleared by the [place] FDA and  has been authorized for detection and/or diagnosis of SARS-CoV-2 by FDA under an Emergency Use Authorization (EUA). This EUA will remain  in effect (meaning this test can be used) for the duration of the COVID-19 declaration under Se ction 564(b)(1) of the Act, 21 U.S.C. section 360bbb-3(b)(1), unless the authorization is terminated or revoked sooner.  Performed at [facility], [place] [address], [place], [place] [address]    No results found.  Review of Systems  Constitutional: Positive for activity change.  HENT: Negative.   Respiratory: Negative.   Cardiovascular: Negative.   Gastrointestinal: Negative.   Genitourinary: Negative.   Musculoskeletal:  Positive for back pain and gait problem.  Neurological: Positive for numbness.  Psychiatric/Behavioral: Negative.     There were no vitals taken for this visit. Physical Exam HENT:     Head: Normocephalic and atraumatic.     Nose: Nose normal.  Eyes:     Extraocular Movements: Extraocular movements intact.  Cardiovascular:     Rate and Rhythm: Regular rhythm.     Heart sounds: Normal heart sounds.  Pulmonary:     Effort: Pulmonary effort is normal. No respiratory distress.     Breath sounds: Normal breath sounds.  Abdominal:     General: Bowel sounds are normal. There is no  distension.     Tenderness: There is no abdominal tenderness.  Musculoskeletal:        General: Tenderness present.  Neurological:     Mental Status: He is alert and oriented to person, place, and time.  Psychiatric:        Mood and Affect: Mood normal.      Assessment/Plan L4-5 stenosis, back pain and lower extremity radiculopathy  We will proceed with L4-5 decompression as scheduled.  Surgical procedure discussed and all questions answered.  Benjiman Core, PA-C 03/17/2021, 5:28 PM

## 2021-03-17 NOTE — Anesthesia Preprocedure Evaluation (Addendum)
Anesthesia Evaluation  Patient identified by MRN, date of birth, ID band Patient awake    Reviewed: Allergy & Precautions, NPO status , Patient's Chart, lab work & pertinent test results  Airway Mallampati: III  TM Distance: >3 FB Neck ROM: Limited    Dental  (+) Teeth Intact, Dental Advisory Given   Pulmonary neg pulmonary ROS,    Pulmonary exam normal breath sounds clear to auscultation       Cardiovascular hypertension, Pt. on medications Normal cardiovascular exam Rhythm:Regular Rate:Normal  EKG: 03/16/21: Normal sinus rhythm Left axis deviation Incomplete right bundle branch block   Neuro/Psych PSYCHIATRIC DISORDERS Anxiety cervical disc disease (s/p C5-6 ACDF 06/22/19)  Neuromuscular disease    GI/Hepatic Neg liver ROS, GERD  Controlled,  Endo/Other  negative endocrine ROSObesity   Renal/GU negative Renal ROS     Musculoskeletal  (+) Arthritis ,   Abdominal   Peds  Hematology negative hematology ROS (+)   Anesthesia Other Findings   Reproductive/Obstetrics                           Anesthesia Physical Anesthesia Plan  ASA: II  Anesthesia Plan: General   Post-op Pain Management:    Induction: Intravenous  PONV Risk Score and Plan: 3 and Midazolam, Dexamethasone, Ondansetron and Diphenhydramine  Airway Management Planned: Oral ETT and Video Laryngoscope Planned  Additional Equipment:   Intra-op Plan:   Post-operative Plan: Extubation in OR  Informed Consent: I have reviewed the patients History and Physical, chart, labs and discussed the procedure including the risks, benefits and alternatives for the proposed anesthesia with the patient or authorized representative who has indicated his/her understanding and acceptance.     Dental advisory given  Plan Discussed with: CRNA  Anesthesia Plan Comments: (PAT note written 03/17/2021 by Myra Gianotti, PA-C. )       Anesthesia Quick Evaluation

## 2021-03-20 ENCOUNTER — Ambulatory Visit (HOSPITAL_COMMUNITY): Payer: Medicare Other | Admitting: Anesthesiology

## 2021-03-20 ENCOUNTER — Ambulatory Visit (HOSPITAL_COMMUNITY): Payer: Medicare Other

## 2021-03-20 ENCOUNTER — Ambulatory Visit (HOSPITAL_COMMUNITY)
Admission: RE | Disposition: A | Discharge status: Home / Self Care | Payer: Self-pay | Source: Home / Self Care | Attending: Specialist

## 2021-03-20 ENCOUNTER — Ambulatory Visit (HOSPITAL_COMMUNITY)
Admission: RE | Admit: 2021-03-20 | Discharge: 2021-03-21 | Disposition: A | Discharge status: Home / Self Care | Payer: Medicare Other | Attending: Specialist | Admitting: Specialist

## 2021-03-20 ENCOUNTER — Ambulatory Visit (HOSPITAL_COMMUNITY): Payer: Medicare Other | Admitting: Vascular Surgery

## 2021-03-20 ENCOUNTER — Encounter (HOSPITAL_COMMUNITY): Payer: Self-pay | Admitting: Specialist

## 2021-03-20 DIAGNOSIS — K219 Gastro-esophageal reflux disease without esophagitis: Secondary | ICD-10-CM | POA: Insufficient documentation

## 2021-03-20 DIAGNOSIS — Z981 Arthrodesis status: Secondary | ICD-10-CM | POA: Diagnosis not present

## 2021-03-20 DIAGNOSIS — I10 Essential (primary) hypertension: Secondary | ICD-10-CM | POA: Insufficient documentation

## 2021-03-20 DIAGNOSIS — M48061 Spinal stenosis, lumbar region without neurogenic claudication: Secondary | ICD-10-CM | POA: Diagnosis present

## 2021-03-20 DIAGNOSIS — M5416 Radiculopathy, lumbar region: Secondary | ICD-10-CM | POA: Diagnosis not present

## 2021-03-20 DIAGNOSIS — M48062 Spinal stenosis, lumbar region with neurogenic claudication: Secondary | ICD-10-CM | POA: Diagnosis not present

## 2021-03-20 DIAGNOSIS — Z419 Encounter for procedure for purposes other than remedying health state, unspecified: Secondary | ICD-10-CM

## 2021-03-20 HISTORY — PX: LUMBAR LAMINECTOMY/DECOMPRESSION MICRODISCECTOMY: SHX5026

## 2021-03-20 SURGERY — LUMBAR LAMINECTOMY/DECOMPRESSION MICRODISCECTOMY
Anesthesia: General | Site: Spine Lumbar

## 2021-03-20 MED ORDER — PHENOL 1.4 % MT LIQD: 1.0000 | OROMUCOSAL | Status: DC | PRN

## 2021-03-20 MED ORDER — ROCURONIUM BROMIDE 10 MG/ML (PF) SYRINGE
PREFILLED_SYRINGE | INTRAVENOUS | Status: AC
  Filled 2021-03-20: qty 10

## 2021-03-20 MED ORDER — FENTANYL CITRATE (PF) 100 MCG/2ML IJ SOLN
INTRAMUSCULAR | Status: DC | PRN
  Administered 2021-03-20: 150 ug via INTRAVENOUS
  Administered 2021-03-20 (×2): 50 ug via INTRAVENOUS

## 2021-03-20 MED ORDER — ACETAMINOPHEN 325 MG PO TABS: 650.0000 mg | ORAL_TABLET | ORAL | Status: DC | PRN

## 2021-03-20 MED ORDER — EPHEDRINE 5 MG/ML INJ
INTRAVENOUS | Status: AC
  Filled 2021-03-20: qty 10

## 2021-03-20 MED ORDER — SODIUM CHLORIDE 0.9% FLUSH: 3.0000 mL | Freq: Two times a day (BID) | INTRAVENOUS | Status: DC

## 2021-03-20 MED ORDER — GABAPENTIN 400 MG PO CAPS
800.0000 mg | ORAL_CAPSULE | Freq: Three times a day (TID) | ORAL | Status: DC
  Administered 2021-03-20 – 2021-03-21 (×3): 800 mg via ORAL
  Filled 2021-03-20 (×4): qty 2

## 2021-03-20 MED ORDER — BUPIVACAINE LIPOSOME 1.3 % IJ SUSP
INTRAMUSCULAR | Status: AC
  Filled 2021-03-20: qty 20

## 2021-03-20 MED ORDER — CEFAZOLIN SODIUM-DEXTROSE 2-4 GM/100ML-% IV SOLN
2.0000 g | Freq: Three times a day (TID) | INTRAVENOUS | Status: AC
  Administered 2021-03-20 (×2): 2 g via INTRAVENOUS
  Filled 2021-03-20 (×2): qty 100

## 2021-03-20 MED ORDER — DEXAMETHASONE SODIUM PHOSPHATE 10 MG/ML IJ SOLN
INTRAMUSCULAR | Status: AC
  Filled 2021-03-20: qty 1

## 2021-03-20 MED ORDER — DIPHENHYDRAMINE HCL 50 MG/ML IJ SOLN
INTRAMUSCULAR | Status: AC
  Filled 2021-03-20: qty 1

## 2021-03-20 MED ORDER — MIDAZOLAM HCL 2 MG/2ML IJ SOLN
INTRAMUSCULAR | Status: AC
  Filled 2021-03-20: qty 2

## 2021-03-20 MED ORDER — PROMETHAZINE HCL 25 MG/ML IJ SOLN: 6.2500 mg | INTRAMUSCULAR | Status: DC | PRN

## 2021-03-20 MED ORDER — BISACODYL 5 MG PO TBEC: 5.0000 mg | DELAYED_RELEASE_TABLET | Freq: Every day | ORAL | Status: DC | PRN

## 2021-03-20 MED ORDER — FENTANYL CITRATE (PF) 100 MCG/2ML IJ SOLN
INTRAMUSCULAR | Status: AC
  Filled 2021-03-20: qty 2

## 2021-03-20 MED ORDER — METHOCARBAMOL 500 MG PO TABS
500.0000 mg | ORAL_TABLET | Freq: Four times a day (QID) | ORAL | Status: DC | PRN
  Administered 2021-03-20 – 2021-03-21 (×3): 500 mg via ORAL
  Filled 2021-03-20 (×2): qty 1

## 2021-03-20 MED ORDER — ACETAMINOPHEN 500 MG PO TABS
1000.0000 mg | ORAL_TABLET | Freq: Once | ORAL | Status: AC
  Administered 2021-03-20: 1000 mg via ORAL
  Filled 2021-03-20: qty 2

## 2021-03-20 MED ORDER — CEFAZOLIN IN SODIUM CHLORIDE 3-0.9 GM/100ML-% IV SOLN
INTRAVENOUS | Status: AC
  Filled 2021-03-20: qty 100

## 2021-03-20 MED ORDER — PHENYLEPHRINE HCL-NACL 10-0.9 MG/250ML-% IV SOLN
INTRAVENOUS | Status: DC | PRN
  Administered 2021-03-20: 50 ug/min via INTRAVENOUS

## 2021-03-20 MED ORDER — CELECOXIB 200 MG PO CAPS
200.0000 mg | ORAL_CAPSULE | Freq: Every day | ORAL | Status: DC
  Administered 2021-03-20 – 2021-03-21 (×2): 200 mg via ORAL
  Filled 2021-03-20 (×3): qty 1

## 2021-03-20 MED ORDER — THROMBIN 20000 UNITS EX SOLR
CUTANEOUS | Status: DC | PRN
  Administered 2021-03-20: 20 mL via TOPICAL

## 2021-03-20 MED ORDER — FLEET ENEMA 7-19 GM/118ML RE ENEM: 1.0000 | ENEMA | Freq: Once | RECTAL | Status: DC | PRN

## 2021-03-20 MED ORDER — THROMBIN (RECOMBINANT) 20000 UNITS EX SOLR
CUTANEOUS | Status: AC
  Filled 2021-03-20: qty 20000

## 2021-03-20 MED ORDER — ONDANSETRON HCL 4 MG/2ML IJ SOLN
INTRAMUSCULAR | Status: AC
  Filled 2021-03-20: qty 2

## 2021-03-20 MED ORDER — SODIUM CHLORIDE 0.9 % IV SOLN: INTRAVENOUS | Status: DC

## 2021-03-20 MED ORDER — ONDANSETRON HCL 4 MG/2ML IJ SOLN: 4.0000 mg | Freq: Four times a day (QID) | INTRAMUSCULAR | Status: DC | PRN

## 2021-03-20 MED ORDER — HYDROCODONE-ACETAMINOPHEN 7.5-325 MG PO TABS
1.0000 | ORAL_TABLET | Freq: Four times a day (QID) | ORAL | Status: DC
  Administered 2021-03-20 – 2021-03-21 (×3): 1 via ORAL
  Filled 2021-03-20 (×4): qty 1

## 2021-03-20 MED ORDER — DOCUSATE SODIUM 100 MG PO CAPS
100.0000 mg | ORAL_CAPSULE | Freq: Two times a day (BID) | ORAL | Status: DC
  Administered 2021-03-20 – 2021-03-21 (×3): 100 mg via ORAL
  Filled 2021-03-20 (×3): qty 1

## 2021-03-20 MED ORDER — BUPIVACAINE HCL 0.5 % IJ SOLN
INTRAMUSCULAR | Status: DC | PRN
  Administered 2021-03-20: 10 mL

## 2021-03-20 MED ORDER — HYDROCODONE-ACETAMINOPHEN 7.5-325 MG PO TABS: 1.0000 | ORAL_TABLET | ORAL | Status: DC | PRN

## 2021-03-20 MED ORDER — BUPIVACAINE HCL (PF) 0.5 % IJ SOLN
INTRAMUSCULAR | Status: AC
  Filled 2021-03-20: qty 30

## 2021-03-20 MED ORDER — ALUM & MAG HYDROXIDE-SIMETH 200-200-20 MG/5ML PO SUSP: 30.0000 mL | Freq: Four times a day (QID) | ORAL | Status: DC | PRN

## 2021-03-20 MED ORDER — EPHEDRINE SULFATE-NACL 50-0.9 MG/10ML-% IV SOSY
PREFILLED_SYRINGE | INTRAVENOUS | Status: DC | PRN
  Administered 2021-03-20: 5 mg via INTRAVENOUS
  Administered 2021-03-20: 10 mg via INTRAVENOUS
  Administered 2021-03-20: 5 mg via INTRAVENOUS

## 2021-03-20 MED ORDER — PROPOFOL 10 MG/ML IV BOLUS
INTRAVENOUS | Status: DC | PRN
  Administered 2021-03-20: 150 mg via INTRAVENOUS

## 2021-03-20 MED ORDER — SUGAMMADEX SODIUM 200 MG/2ML IV SOLN
INTRAVENOUS | Status: DC | PRN
  Administered 2021-03-20: 200 mg via INTRAVENOUS
  Administered 2021-03-20: 50 mg via INTRAVENOUS

## 2021-03-20 MED ORDER — DEXAMETHASONE SODIUM PHOSPHATE 10 MG/ML IJ SOLN
INTRAMUSCULAR | Status: DC | PRN
  Administered 2021-03-20: 10 mg via INTRAVENOUS

## 2021-03-20 MED ORDER — METHOCARBAMOL 1000 MG/10ML IJ SOLN
500.0000 mg | Freq: Four times a day (QID) | INTRAVENOUS | Status: DC | PRN
  Filled 2021-03-20: qty 5

## 2021-03-20 MED ORDER — ALBUMIN HUMAN 5 % IV SOLN: INTRAVENOUS | Status: DC | PRN

## 2021-03-20 MED ORDER — METHOCARBAMOL 500 MG PO TABS
ORAL_TABLET | ORAL | Status: AC
  Filled 2021-03-20: qty 1

## 2021-03-20 MED ORDER — SODIUM CHLORIDE 0.9 % IV SOLN: 250.0000 mL | INTRAVENOUS | Status: DC

## 2021-03-20 MED ORDER — CHLORHEXIDINE GLUCONATE 0.12 % MT SOLN
15.0000 mL | Freq: Once | OROMUCOSAL | Status: AC
  Administered 2021-03-20: 15 mL via OROMUCOSAL
  Filled 2021-03-20: qty 15

## 2021-03-20 MED ORDER — FENTANYL CITRATE (PF) 250 MCG/5ML IJ SOLN
INTRAMUSCULAR | Status: AC
  Filled 2021-03-20: qty 5

## 2021-03-20 MED ORDER — SODIUM CHLORIDE 0.9% FLUSH: 3.0000 mL | INTRAVENOUS | Status: DC | PRN

## 2021-03-20 MED ORDER — ONDANSETRON HCL 4 MG/2ML IJ SOLN
INTRAMUSCULAR | Status: DC | PRN
  Administered 2021-03-20: 4 mg via INTRAVENOUS

## 2021-03-20 MED ORDER — DIPHENHYDRAMINE HCL 50 MG/ML IJ SOLN
INTRAMUSCULAR | Status: DC | PRN
  Administered 2021-03-20: 12.5 mg via INTRAVENOUS

## 2021-03-20 MED ORDER — ONDANSETRON HCL 4 MG PO TABS: 4.0000 mg | ORAL_TABLET | Freq: Four times a day (QID) | ORAL | Status: DC | PRN

## 2021-03-20 MED ORDER — DEXMEDETOMIDINE (PRECEDEX) IN NS 20 MCG/5ML (4 MCG/ML) IV SYRINGE
PREFILLED_SYRINGE | INTRAVENOUS | Status: AC
  Filled 2021-03-20: qty 5

## 2021-03-20 MED ORDER — HYDROCODONE-ACETAMINOPHEN 7.5-325 MG PO TABS
2.0000 | ORAL_TABLET | ORAL | Status: DC | PRN
  Administered 2021-03-20 – 2021-03-21 (×2): 2 via ORAL
  Filled 2021-03-20 (×3): qty 2

## 2021-03-20 MED ORDER — ROCURONIUM BROMIDE 10 MG/ML (PF) SYRINGE
PREFILLED_SYRINGE | INTRAVENOUS | Status: DC | PRN
  Administered 2021-03-20: 10 mg via INTRAVENOUS
  Administered 2021-03-20: 20 mg via INTRAVENOUS
  Administered 2021-03-20: 70 mg via INTRAVENOUS

## 2021-03-20 MED ORDER — BUPIVACAINE LIPOSOME 1.3 % IJ SUSP
10.0000 mL | Freq: Once | INTRAMUSCULAR | Status: DC
  Filled 2021-03-20: qty 10

## 2021-03-20 MED ORDER — DEXMEDETOMIDINE (PRECEDEX) IN NS 20 MCG/5ML (4 MCG/ML) IV SYRINGE
PREFILLED_SYRINGE | INTRAVENOUS | Status: DC | PRN
  Administered 2021-03-20: 8 ug via INTRAVENOUS

## 2021-03-20 MED ORDER — LISINOPRIL 10 MG PO TABS
10.0000 mg | ORAL_TABLET | Freq: Every day | ORAL | Status: DC
  Administered 2021-03-21: 10 mg via ORAL
  Filled 2021-03-20: qty 1

## 2021-03-20 MED ORDER — FENTANYL CITRATE (PF) 100 MCG/2ML IJ SOLN
25.0000 ug | INTRAMUSCULAR | Status: DC | PRN
  Administered 2021-03-20: 25 ug via INTRAVENOUS

## 2021-03-20 MED ORDER — PROPOFOL 10 MG/ML IV BOLUS
INTRAVENOUS | Status: AC
  Filled 2021-03-20: qty 40

## 2021-03-20 MED ORDER — 0.9 % SODIUM CHLORIDE (POUR BTL) OPTIME
TOPICAL | Status: DC | PRN
  Administered 2021-03-20: 1000 mL

## 2021-03-20 MED ORDER — BUPIVACAINE LIPOSOME 1.3 % IJ SUSP
INTRAMUSCULAR | Status: DC | PRN
  Administered 2021-03-20: 10 mL

## 2021-03-20 MED ORDER — ORAL CARE MOUTH RINSE: 15.0000 mL | Freq: Once | OROMUCOSAL | Status: AC

## 2021-03-20 MED ORDER — MIDAZOLAM HCL 5 MG/5ML IJ SOLN
INTRAMUSCULAR | Status: DC | PRN
  Administered 2021-03-20: 2 mg via INTRAVENOUS

## 2021-03-20 MED ORDER — ACETAMINOPHEN 650 MG RE SUPP: 650.0000 mg | RECTAL | Status: DC | PRN

## 2021-03-20 MED ORDER — PHENYLEPHRINE 40 MCG/ML (10ML) SYRINGE FOR IV PUSH (FOR BLOOD PRESSURE SUPPORT)
PREFILLED_SYRINGE | INTRAVENOUS | Status: AC
  Filled 2021-03-20: qty 10

## 2021-03-20 MED ORDER — POLYETHYLENE GLYCOL 3350 17 G PO PACK: 17.0000 g | PACK | Freq: Every day | ORAL | Status: DC | PRN

## 2021-03-20 MED ORDER — ATORVASTATIN CALCIUM 10 MG PO TABS
10.0000 mg | ORAL_TABLET | Freq: Every day | ORAL | Status: DC
  Administered 2021-03-20 – 2021-03-21 (×2): 10 mg via ORAL
  Filled 2021-03-20 (×2): qty 1

## 2021-03-20 MED ORDER — MENTHOL 3 MG MT LOZG: 1.0000 | LOZENGE | OROMUCOSAL | Status: DC | PRN

## 2021-03-20 MED ORDER — LIDOCAINE 2% (20 MG/ML) 5 ML SYRINGE
INTRAMUSCULAR | Status: AC
  Filled 2021-03-20: qty 5

## 2021-03-20 MED ORDER — PHENYLEPHRINE 40 MCG/ML (10ML) SYRINGE FOR IV PUSH (FOR BLOOD PRESSURE SUPPORT)
PREFILLED_SYRINGE | INTRAVENOUS | Status: DC | PRN
  Administered 2021-03-20: 120 ug via INTRAVENOUS
  Administered 2021-03-20: 80 ug via INTRAVENOUS
  Administered 2021-03-20: 120 ug via INTRAVENOUS
  Administered 2021-03-20: 80 ug via INTRAVENOUS

## 2021-03-20 MED ORDER — LIDOCAINE 2% (20 MG/ML) 5 ML SYRINGE
INTRAMUSCULAR | Status: DC | PRN
  Administered 2021-03-20: 100 mg via INTRAVENOUS

## 2021-03-20 MED ORDER — LACTATED RINGERS IV SOLN: INTRAVENOUS | Status: DC

## 2021-03-20 SURGICAL SUPPLY — 48 items
BUR SABER RD CUTTING 3.0 (BURR) ×2 IMPLANT
BUR SABER RD CUTTING 3.0MM (BURR) ×1
CANISTER SUCT 3000ML PPV (MISCELLANEOUS) ×3 IMPLANT
COVER SURGICAL LIGHT HANDLE (MISCELLANEOUS) ×3 IMPLANT
COVER WAND RF STERILE (DRAPES) IMPLANT
DERMABOND ADVANCED (GAUZE/BANDAGES/DRESSINGS) ×2
DERMABOND ADVANCED .7 DNX12 (GAUZE/BANDAGES/DRESSINGS) ×1 IMPLANT
DRAPE HALF SHEET 40X57 (DRAPES) IMPLANT
DRAPE INCISE IOBAN 66X45 STRL (DRAPES) ×3 IMPLANT
DRAPE MICROSCOPE LEICA (MISCELLANEOUS) ×3 IMPLANT
DRAPE SURG 17X23 STRL (DRAPES) ×12 IMPLANT
DRSG MEPILEX BORDER 4X4 (GAUZE/BANDAGES/DRESSINGS) IMPLANT
DRSG MEPILEX BORDER 4X8 (GAUZE/BANDAGES/DRESSINGS) ×3 IMPLANT
DURAPREP 26ML APPLICATOR (WOUND CARE) ×3 IMPLANT
ELECT REM PT RETURN 9FT ADLT (ELECTROSURGICAL) ×3
ELECTRODE REM PT RTRN 9FT ADLT (ELECTROSURGICAL) ×1 IMPLANT
EVACUATOR 1/8 PVC DRAIN (DRAIN) IMPLANT
GLOVE ECLIPSE 9.0 STRL (GLOVE) ×3 IMPLANT
GLOVE ORTHO TXT STRL SZ7.5 (GLOVE) ×3 IMPLANT
GLOVE SRG 8 PF TXTR STRL LF DI (GLOVE) ×1 IMPLANT
GLOVE SURG 8.5 LATEX PF (GLOVE) ×3 IMPLANT
GLOVE SURG UNDER POLY LF SZ8 (GLOVE) ×2
GOWN STRL REUS W/ TWL LRG LVL3 (GOWN DISPOSABLE) ×1 IMPLANT
GOWN STRL REUS W/TWL 2XL LVL3 (GOWN DISPOSABLE) ×6 IMPLANT
GOWN STRL REUS W/TWL LRG LVL3 (GOWN DISPOSABLE) ×2
KIT BASIN OR (CUSTOM PROCEDURE TRAY) ×3 IMPLANT
KIT TURNOVER KIT B (KITS) ×3 IMPLANT
NEEDLE SPNL 18GX3.5 QUINCKE PK (NEEDLE) ×3 IMPLANT
NS IRRIG 1000ML POUR BTL (IV SOLUTION) ×3 IMPLANT
PACK LAMINECTOMY ORTHO (CUSTOM PROCEDURE TRAY) ×3 IMPLANT
PAD ARMBOARD 7.5X6 YLW CONV (MISCELLANEOUS) ×6 IMPLANT
PATTIES SURGICAL .5 X.5 (GAUZE/BANDAGES/DRESSINGS) IMPLANT
PATTIES SURGICAL .75X.75 (GAUZE/BANDAGES/DRESSINGS) ×3 IMPLANT
PATTIES SURGICAL 1X1 (DISPOSABLE) IMPLANT
SPONGE LAP 4X18 RFD (DISPOSABLE) IMPLANT
SPONGE SURGIFOAM ABS GEL 100 (HEMOSTASIS) IMPLANT
SUT VIC AB 0 CT1 27 (SUTURE) ×2
SUT VIC AB 0 CT1 27XBRD ANBCTR (SUTURE) ×1 IMPLANT
SUT VIC AB 1 CT1 27 (SUTURE) ×2
SUT VIC AB 1 CT1 27XBRD ANBCTR (SUTURE) ×1 IMPLANT
SUT VIC AB 2-0 CT1 27 (SUTURE) ×2
SUT VIC AB 2-0 CT1 TAPERPNT 27 (SUTURE) ×1 IMPLANT
SUT VIC AB 3-0 X1 27 (SUTURE) IMPLANT
SUT VICRYL 0 UR6 27IN ABS (SUTURE) IMPLANT
TOWEL GREEN STERILE (TOWEL DISPOSABLE) ×3 IMPLANT
TOWEL GREEN STERILE FF (TOWEL DISPOSABLE) ×3 IMPLANT
TRAY FOLEY MTR SLVR 16FR STAT (SET/KITS/TRAYS/PACK) IMPLANT
WATER STERILE IRR 1000ML POUR (IV SOLUTION) ×3 IMPLANT

## 2021-03-20 NOTE — Discharge Instructions (Addendum)
    No lifting greater than 10 lbs. Avoid bending, stooping and twisting. Walk in house for first week them may start to get out slowly increasing distance up to one mile by 3 weeks post op. Keep incision dry for 3 days, may use tegaderm or similar water impervious dressing.  

## 2021-03-20 NOTE — Anesthesia Procedure Notes (Signed)
Procedure Name: Intubation Date/Time: 03/20/2021 8:02 AM Performed by: Jenne Campus, CRNA Pre-anesthesia Checklist: Patient identified, Emergency Drugs available, Suction available and Patient being monitored Patient Re-evaluated:Patient Re-evaluated prior to induction Oxygen Delivery Method: Circle System Utilized Preoxygenation: Pre-oxygenation with 100% oxygen Induction Type: IV induction Ventilation: Mask ventilation without difficulty Laryngoscope Size: Glidescope and 4 Grade View: Grade I Tube type: Oral Tube size: 7.5 mm Number of attempts: 1 Airway Equipment and Method: Stylet,  Oral airway and Video-laryngoscopy Placement Confirmation: ETT inserted through vocal cords under direct vision,  positive ETCO2 and breath sounds checked- equal and bilateral Secured at: 23 cm Tube secured with: Tape Dental Injury: Teeth and Oropharynx as per pre-operative assessment

## 2021-03-20 NOTE — Brief Op Note (Signed)
03/20/2021  10:10 AM  PATIENT:  Adam Hernandez  55 y.o. male  PRE-OPERATIVE DIAGNOSIS:  bilateral L4-5 lateral recess stenosis  POST-OPERATIVE DIAGNOSIS:  bilateral L4-5 lateral recess stenosis  PROCEDURE:  Procedure(s): BILATERAL LATERAL RECESS DECOMPRESSION/PARTIAL HEMILAMINECTOMIES LUMBAR FOUR-FIVE (N/A)  SURGEON:  Surgeon(s) and Role:    * Jessy Oto, MD - Primary  PHYSICIAN ASSISTANT: Esaw Grandchild  ANESTHESIA:   local and general  EBL:  200 mL   BLOOD ADMINISTERED:none  DRAINS: none   LOCAL MEDICATIONS USED:  MARCAINE 0.5% 1:1 EXPAREL 1.3%    Amount: 20 ml  SPECIMEN:  No Specimen  DISPOSITION OF SPECIMEN:  N/A  COUNTS:  YES  TOURNIQUET:  * No tourniquets in log *  DICTATION: .Dragon Dictation  PLAN OF CARE: Admit for overnight observation  PATIENT DISPOSITION:  PACU - hemodynamically stable.   Delay start of Pharmacological VTE agent (>24hrs) due to surgical blood loss or risk of bleeding: yes

## 2021-03-20 NOTE — Plan of Care (Signed)
Patient was explained moving restrictions. He expressed understanding on the importance of pain control and increased mobility.

## 2021-03-20 NOTE — Transfer of Care (Signed)
Immediate Anesthesia Transfer of Care Note  Patient: Adam Hernandez  Procedure(s) Performed: BILATERAL LATERAL RECESS DECOMPRESSION/PARTIAL HEMILAMINECTOMIES LUMBAR FOUR-FIVE (N/A Spine Lumbar)  Patient Location: PACU  Anesthesia Type:General  Level of Consciousness: oriented, drowsy and patient cooperative  Airway & Oxygen Therapy: Patient Spontanous Breathing and Patient connected to face mask oxygen  Post-op Assessment: Report given to RN and Post -op Vital signs reviewed and stable  Post vital signs: Reviewed  Last Vitals:  Vitals Value Taken Time  BP 130/74 03/20/21 1046  Temp    Pulse 91 03/20/21 1047  Resp 18 03/20/21 1047  SpO2 95 % 03/20/21 1047  Vitals shown include unvalidated device data.  Last Pain:  Vitals:   03/20/21 0630  TempSrc:   PainSc: 4       Patients Stated Pain Goal: 5 (57/01/77 9390)  Complications: No complications documented.

## 2021-03-20 NOTE — Plan of Care (Signed)
  Problem: Education: Goal: Ability to verbalize activity precautions or restrictions will improve Outcome: Progressing   

## 2021-03-20 NOTE — Anesthesia Postprocedure Evaluation (Signed)
Anesthesia Post Note  Patient: Kean Gautreau [place]  Procedure(s) Performed: BILATERAL LATERAL RECESS DECOMPRESSION/PARTIAL HEMILAMINECTOMIES LUMBAR FOUR-FIVE (N/A Spine Lumbar)     Patient location during evaluation: PACU Anesthesia Type: General Level of consciousness: awake and alert Pain management: pain level controlled Vital Signs Assessment: post-procedure vital signs reviewed and stable Respiratory status: spontaneous breathing, nonlabored ventilation, respiratory function stable and patient connected to nasal cannula oxygen Cardiovascular status: blood pressure returned to baseline and stable Postop Assessment: no apparent nausea or vomiting Anesthetic complications: no   No complications documented.  Last Vitals:  Vitals:   03/20/21 1230 03/20/21 1623  BP: 133/83 121/75  Pulse: 72 69  Resp: 18 18  Temp: 36.7 C 36.6 C  SpO2: 99% 96%    Last Pain:  Vitals:   03/20/21 1623  TempSrc: Oral  PainSc:                  Catalina Gravel

## 2021-03-20 NOTE — Interval H&P Note (Signed)
History and Physical Interval Note:  03/20/2021 7:41 AM  Adam Hernandez  has presented today for surgery, with the diagnosis of bilateral L4-5 lateral recess stenosis.  The various methods of treatment have been discussed with the patient and family. After consideration of risks, benefits and other options for treatment, the patient has consented to  Procedure(s): BILATERAL LATERAL RECESS DECOMPRESSION/PARTIAL HEMILAMINECTOMIES L4-5 (N/A) as a surgical intervention.  The patient's history has been reviewed, patient examined, no change in status, stable for surgery.  I have reviewed the patient's chart and labs.  Questions were answered to the patient's satisfaction.     [name]

## 2021-03-20 NOTE — Op Note (Signed)
03/20/2021  10:12 AM  PATIENT:  Adam Hernandez  55 y.o. male  MRN: [id number]  OPERATIVE REPORT  PRE-OPERATIVE DIAGNOSIS:  bilateral L4-5 lateral recess stenosis  POST-OPERATIVE DIAGNOSIS:  bilateral L4-5 lateral recess stenosis  PROCEDURE:  Procedure(s): BILATERAL LATERAL RECESS DECOMPRESSION/PARTIAL HEMILAMINECTOMIES LUMBAR FOUR-FIVE    SURGEON:  Jessy Oto, MD     ASSISTANT:  Benjiman Core, PA-C  (Present throughout the entire procedure and necessary for completion of procedure in a timely manner)     ANESTHESIA:  General,supplemented with local marcaine 0.5% 1:1 exparel 1.3% total 16SA    COMPLICATIONS:  None.   PROCEDURE:The patient was met in the holding area, and the appropriate bilateral lumbar level L4-5 identified and marked with "x" and my initials.The patient was then transported to OR and was placed under general anesthesia without difficulty. The patient received appropriate preoperative antibiotic prophylaxis. The patient after intubation atraumatically was transferred to the operating room table, prone position, Wilson frame, sliding OR table. All pressure points were well padded. The arms in 90-90 well-padded at the elbows. Standard prep with CHG solution lower dorsal spine to the mid sacral segment. Draped in the usual manner clear Vi-Drape was used. Time-out procedure was called and correct. An 18-gauge spinal needle was then inserted at the expected L4-5 level.Cross table lateral cassette was draped sterilely to the field and cross table radiograph used to identify the spinal needle position. The needle was at the lower aspect of the lamina of L5. Skin superior to this was then infiltrated with local marcaine 0.5% 1:1 exparel 1.3% total 20cc  used. An incision approximately an inch inch and a half in length was then made through skin and subcutaneous layers in line with the expected midline just superior to the spinal needle entry point. An incision made into the  bilateral lumbosacral fascia approximately an inch and a half in length .  Cobb elevator was then introduced into the incision site and used to carefully form subperiosteal dissection of the paralumbar muscles off of the posterior lamina of the expected L4-5 level. A localization lateral cross table lateral view was obtained with Kocher clamp at the L4-5 facet. 2 mm Kerrison then used to enter the spinal canal over the superior aspect of the L5 lamina carefully using the Kerrison to debris the attachment as a curet.  The depth measured off of the Cobb elevator at about 70 mm and 70 mm retractor then placed on the scaffolding for the [facility] equipment and guided down to and docking on the posterior aspect of the lamina at the expected bilateral L4-5 level. The operating room microscope sterilely draped brought into the field. Under the operating room microscope, the left L4-5 interspace carefully debrided the small amount of muscle attachment here and high-speed bur used to drill the medial aspect of the inferior articular process of L4 approximately 20%. A localization lateral cross table lateral view was obtained with Penfield 4 in the L4-5 facet. 2 mm Kerrison then used to enter the spinal canal over the superior aspect of the L5 lamina carefully using the Kerrison to debris the attachment as a curet. Foraminotomy was then performed over the left L5 nerve root. The medial 10% superior articular process of L5 and then resected using 2 mm Kerrison. This allowed for identification of the thecal sac. Penfield 4 was then used to carefully mobilize the thecal sac medially and the L5 nerve root identified within the lateral recess flattened over the posterior aspect of the  protruded disc. Carefully the lateral aspect of the L5 nerve root was identified and a Penfield 4 was used to mobilize the nerve medially such that the protruded disc was visible with microscope. Using a Penfield 4 for retraction and a 15  blade scalpel was used to incise the posterior longitudinal ligament within the lateral recess on the left side longitudinally. Disc material was removed using micropituitary rongeurs and nerve hook nerve root and then more easily able to be mobilized medially and retracted using a Derricho retractor. Further foraminotomies was performed over the left L5 nerve root the nerve root was noted to be exiting without further compression. The nerve root able to be retracted along the medial aspect of the L5 pedicle and disc material found to be subligamentous at this level was further resected current pituitary rongeurs.  Ligamentum flavum was further debrided superiorly to the level L4-5 disc. Had a moderate amount of further resection of the L4 lamina inferiorly and medially was performed. With this then the disc space at L4-5 was easily visualized and entry into the disc at the sided disc herniation was possible using a Penfield 4 intraoperative. Micropituitary was used to further debride disc material superficially from the posterior aspect of the intervertebral disc and posterior lateral aspect of the disc. Small amount of further disc material was found subligamentous extending caudally from the disc this was removed using micropituitary rongeurs into the right L4 neuroforamen additionally epstein  currettes were used to decompress the right L4 neuroforamen. Upbiting currettes were used to further remove reflected portions of the reflected portions of the medial L4-5 facet and portions of the superior portion of the L5 superior articular  Facet. Ligamentum flavum was debrided and lateral recess along the medial aspect L4-5 facet no further decompression was necessary. Ball tip nerve probe was then able to carefully palpate the neuroforamen for L4 and L5 finding these to be well decompressed. Attention then to the right L4-5 interspace carefully debrided the small amount of muscle attachment here and high-speed  bur used to drill the medial aspect of the inferior articular process of L4 approximately 20%.2 mm Kerrison then used to enter the spinal canal over the superior aspect of the L5 lamina carefully using the Kerrison to debris the attachment as a curet. Foraminotomy was then performed over the right L5 nerve root. The medial 10% superior articular process of L5 and then resected using 2 mm Kerrison. This allowed for identification of the thecal sac. Penfield 4 was then used to carefully mobilize the thecal sac medially and the L5 nerve root identified within the lateral recess flattened over the posterior aspect of the bulging disc. Carefully the lateral aspect of the L5 nerve root was identified and a Penfield 4 was used to mobilize the nerve medially such that the protruded disc was visible with microscope. Using a Penfield 4 for retraction mobilized medially and retracted using a Derricho retractor. Further foraminotomies was performed over the right L5 nerve root the nerve root was noted to be exiting without further compression. The nerve root able to be retracted along the medial aspect of the L5 pedicle and disc material found to not be herniated but bulging. Ligamentum flavum was further debrided superiorly to the level L4-5 disc. Had a moderate amount of further resection of the L4 lamina inferiorly and medially was performed. With this then the disc space at L4-5 was easily visualized. 5mm Kerrisons were used to further remove reflected portions of the reflected portions of the  medial L4-5 facet and portions of the superior portion of the L5 superior articular  Facet. Ligamentum flavum was debrided and lateral recess along the medial aspect L4-5 facet no further decompression was necessary. Ball tip nerve probe was then able to carefully palpate the neuroforamen for L4 and L5 finding these to be well decompressed. Bleeding was then controlled using thrombin-soaked Gelfoam small cottonoids. Small amount of  bleeding within the soft tissue mass the laminotomy area was controlled using bipolar electrocautery. Irrigation was carried out using copious amounts of irrigant solution. All Gelfoam were then removed. No significant active bleeding present at the time of removal. All instruments sponge counts were correct traction system was then carefully removed carefully rotating retractors with this withdrawal and only bipolar  All instruments sponge counts were correct traction system was then carefully removed and bipolar electrocautery of any small bleeders. Lumbodorsal fascia was then carefully approximated with interrupted 0 Vicryl sutures, UR 6 needle deep subcutaneous layers were approximated with interrupted 0 Vicryl sutures on UR 6 the appear subcutaneous layers approximated with interrupted 2-0 Vicryl sutures and the skin closed with a stainless steel staples then Mepilex bandage applied. Patient was then carefully returned to supine position on a stretcher, reactivated and extubated. He was then returned to recovery room in satisfactory condition. Benjiman Core PA-C perform the duties of assistant surgeon during this case. he was present from the beginning of the case to the end of the case assisting in transfer the patient from his stretcher to the OR table and back to the stretcher at the end of the case. Assisted in careful retraction and suction of the laminectomy site delicate neural structures operating under the operating room microscope. he performed closure of the incision from the fascia to the skin applying the dressing.   [name]  03/20/2021, 10:12 AM

## 2021-03-21 ENCOUNTER — Encounter (HOSPITAL_COMMUNITY): Payer: Self-pay | Admitting: Specialist

## 2021-03-21 DIAGNOSIS — M48061 Spinal stenosis, lumbar region without neurogenic claudication: Secondary | ICD-10-CM | POA: Diagnosis not present

## 2021-03-21 LAB — CBC
HCT: 41.8 % (ref 39.0–52.0)
Hemoglobin: 13.3 g/dL (ref 13.0–17.0)
MCH: 28.9 pg (ref 26.0–34.0)
MCHC: 31.8 g/dL (ref 30.0–36.0)
MCV: 90.9 fL (ref 80.0–100.0)
Platelets: 285 10*3/uL (ref 150–400)
RBC: 4.6 MIL/uL (ref 4.22–5.81)
RDW: 12.7 % (ref 11.5–15.5)
WBC: 14.4 10*3/uL — ABNORMAL HIGH (ref 4.0–10.5)
nRBC: 0 % (ref 0.0–0.2)

## 2021-03-21 MED ORDER — DOCUSATE SODIUM 100 MG PO CAPS: 100.0000 mg | ORAL_CAPSULE | Freq: Two times a day (BID) | ORAL | 0 refills | Status: DC

## 2021-03-21 MED ORDER — METHOCARBAMOL 500 MG PO TABS: 500.0000 mg | ORAL_TABLET | Freq: Four times a day (QID) | ORAL | 1 refills | Status: DC | PRN

## 2021-03-21 MED ORDER — HYDROCODONE-ACETAMINOPHEN 7.5-325 MG PO TABS: 1.0000 | ORAL_TABLET | ORAL | 0 refills | Status: AC | PRN

## 2021-03-21 MED FILL — Thrombin (Recombinant) For Soln 20000 Unit: CUTANEOUS | Qty: 1 | Status: AC

## 2021-03-21 NOTE — Progress Notes (Signed)
     Subjective: 1 Day Post-Op Procedure(s) (LRB): BILATERAL LATERAL RECESS DECOMPRESSION/PARTIAL HEMILAMINECTOMIES LUMBAR FOUR-FIVE (N/A) Awake, alert and oriented x 4. Walking in the hallway, voiding well. I'm ready to do home.  Patient reports pain as moderate.    Objective:   VITALS:  Temp:  [97.6 F (36.4 C)-98.6 F (37 C)] 97.6 F (36.4 C) (05/10 1116) Pulse Rate:  [61-76] 63 (05/10 1116) Resp:  [17-18] 17 (05/10 1116) BP: (110-147)/(68-82) 110/68 (05/10 1116) SpO2:  [96 %-100 %] 100 % (05/10 1116)  Neurologically intact ABD soft Neurovascular intact Sensation intact distally Intact pulses distally Dorsiflexion/Plantar flexion intact Incision: dressing C/D/I and no drainage   LABS Recent Labs    03/21/21 0608  HGB 13.3  WBC 14.4*  PLT 285   No results for input(s): NA, K, CL, CO2, BUN, CREATININE, GLUCOSE in the last 72 hours. No results for input(s): LABPT, INR in the last 72 hours.   Assessment/Plan: 1 Day Post-Op Procedure(s) (LRB): BILATERAL LATERAL RECESS DECOMPRESSION/PARTIAL HEMILAMINECTOMIES LUMBAR FOUR-FIVE (N/A)  Advance diet Up with therapy Discharge home with home health  [name] 03/21/2021, 1:02 PMPatient ID: Adam Hernandez, male   DOB: 16-Jan-1966, 55 y.o.   MRN: [id number]

## 2021-03-21 NOTE — Progress Notes (Signed)
NURSING PROGRESS NOTE  Adam Hernandez [id number] Discharge Data: 03/21/2021 2:41 PM Attending Provider: Jessy Oto, MD BPZ:WCHEN, Iven Finn, MD     Okey Regal Ocallaghan to be D/C'd Home per MD order.  Discussed with the patient the After Visit Summary and all questions fully answered. All IV's discontinued with no bleeding noted. All belongings returned to patient for patient to take home.   Last Vital Signs:  Blood pressure 110/68, pulse 63, temperature 97.6 F (36.4 C), temperature source Oral, resp. rate 17, height 6' (1.829 m), weight 123.4 kg, SpO2 100 %.  Discharge Medication List Allergies as of 03/21/2021   No Known Allergies     Medication List    TAKE these medications   atorvastatin 10 MG tablet Commonly known as: LIPITOR Take 1 tablet (10 mg total) by mouth daily.   celecoxib 200 MG capsule Commonly known as: CELEBREX Take 1 capsule (200 mg total) by mouth daily.   docusate sodium 100 MG capsule Commonly known as: COLACE Take 1 capsule (100 mg total) by mouth 2 (two) times daily.   gabapentin 800 MG tablet Commonly known as: NEURONTIN Take 800 mg by mouth 3 (three) times daily. As directed by pain management   HYDROcodone-acetaminophen 7.5-325 MG tablet Commonly known as: NORCO Take 1-2 tablets by mouth every 4 (four) hours as needed for up to 7 days for moderate pain ((score 4 to 6)).   lisinopril 10 MG tablet Commonly known as: ZESTRIL Take 1 tablet (10 mg total) by mouth daily.   methocarbamol 500 MG tablet Commonly known as: ROBAXIN Take 1 tablet (500 mg total) by mouth every 6 (six) hours as needed for muscle spasms.

## 2021-03-21 NOTE — Progress Notes (Signed)
PT Cancellation Note/ Discharge  Patient Details Name: Adam Hernandez MRN: [id number] DOB: 02-16-1966   Cancelled Treatment:    Reason Eval/Treat Not Completed: PT screened, no needs identified, will sign off (per OT, no PT needs). Leighton Roach, PT  Acute Rehab Services  Pager [phone] Office [place] 03/21/2021, 10:17 AM

## 2021-03-21 NOTE — Evaluation (Signed)
Occupational Therapy Evaluation Patient Details Name: Adam Hernandez MRN: [id number] DOB: Jul 19, 1966 Today's Date: 03/21/2021    History of Present Illness 55 yo male presenting with L4-5 decompression. PMH including anxiety, arthritis, HTN, neuromuscular disorder, and neuropathy.   Clinical Impression   PTA, pt was living with his wife and son and was independent. Currently, pt performing ADLs and functional mobility at Mod I- independent level. Provided education and handout on back precautions, bed mobility, grooming, LB ADLs, toileting, stairs, and tub transfer with shower seat; pt demonstrated understanding. Answered all pt questions. Recommend dc home once medically stable per physician. All acute OT needs met and will sign off. Thank you.    Follow Up Recommendations  No OT follow up    Equipment Recommendations  None recommended by OT    Recommendations for Other Services       Precautions / Restrictions Precautions Precautions: Back Required Braces or Orthoses: Other Brace Other Brace: No brace per MD order      Mobility Bed Mobility Overal bed mobility: Independent             General bed mobility comments: pt using log roll    Transfers Overall transfer level: Independent                    Balance Overall balance assessment: No apparent balance deficits (not formally assessed)                                         ADL either performed or assessed with clinical judgement   ADL Overall ADL's : Modified independent                                       General ADL Comments: Increased time. Providing education on back precautions, bed mobility, grooming, toileting, LB ADLs, shower/tub transfer, and stairs.     Vision Baseline Vision/History: Wears glasses Wears Glasses: At all times Patient Visual Report: No change from baseline       Perception     Praxis      Pertinent Vitals/Pain Pain  Assessment: Faces Faces Pain Scale: Hurts a little bit Pain Location: back Pain Descriptors / Indicators: Discomfort Pain Intervention(s): Monitored during session;Limited activity within patient's tolerance;Repositioned     Hand Dominance Right   Extremity/Trunk Assessment Upper Extremity Assessment Upper Extremity Assessment: Overall WFL for tasks assessed   Lower Extremity Assessment Lower Extremity Assessment: Overall WFL for tasks assessed;RLE deficits/detail RLE Deficits / Details: prior R ankle injury   Cervical / Trunk Assessment Cervical / Trunk Assessment: Other exceptions Cervical / Trunk Exceptions: s/p back sx   Communication Communication Communication: No difficulties   Cognition Arousal/Alertness: Awake/alert Behavior During Therapy: WFL for tasks assessed/performed Overall Cognitive Status: Within Functional Limits for tasks assessed                                     General Comments       Exercises     Shoulder Instructions      Home Living Family/patient expects to be discharged to:: Private residence Living Arrangements: Spouse/significant other;Children Available Help at Discharge: Family;Available PRN/intermittently Type of Home: House Home Access: Stairs to enter [employer] of Steps:  3 Entrance Stairs-Rails: None Home Layout: Two level (split level) Alternate Level Stairs-Number of Steps: 3 - 3 - flight Alternate Level Stairs-Rails: Right;Left;Can reach both Bathroom Shower/Tub: Tub/shower unit;Walk-in shower   Bathroom Toilet: Handicapped height     Home Equipment: [place] - single point;Shower seat          Prior Functioning/Environment Level of Independence: Independent        Comments: ADLs and IADLs. Ankle injury in 2017 - disability        OT Problem List: Decreased range of motion;Decreased knowledge of precautions;Decreased knowledge of use of DME or AE;Pain      OT Treatment/Interventions:       OT Goals(Current goals can be found in the care plan section) Acute Rehab OT Goals Patient Stated Goal: Go home OT Goal Formulation: All assessment and education complete, DC therapy  OT Frequency:     Barriers to D/C:            Co-evaluation              AM-PAC OT "6 Clicks" Daily Activity     Outcome Measure Help from another person eating meals?: None Help from another person taking care of personal grooming?: None Help from another person toileting, which includes using toliet, bedpan, or urinal?: None Help from another person bathing (including washing, rinsing, drying)?: None Help from another person to put on and taking off regular upper body clothing?: None Help from another person to put on and taking off regular lower body clothing?: None 6 Click Score: 24   End of Session Nurse Communication: Mobility status  Activity Tolerance: Patient tolerated treatment well Patient left: in bed;with call bell/phone within reach  OT Visit Diagnosis: Muscle weakness (generalized) (M62.81);Pain Pain - part of body:  (Back)                Time: 9811-9147 OT Time Calculation (min): 20 min Charges:  OT General Charges $OT Visit: 1 Visit OT Evaluation $OT Eval Low Complexity: 1 Low  Tatianna Ibbotson MSOT, OTR/L Acute Rehab Pager: [phone] Office: [place] 03/21/2021, 8:37 AM

## 2021-03-28 ENCOUNTER — Encounter: Payer: Self-pay | Admitting: Specialist

## 2021-03-31 ENCOUNTER — Other Ambulatory Visit: Payer: Self-pay | Admitting: Specialist

## 2021-03-31 MED ORDER — HYDROCODONE-ACETAMINOPHEN 7.5-325 MG PO TABS: 1.0000 | ORAL_TABLET | ORAL | 0 refills | Status: DC | PRN

## 2021-04-06 ENCOUNTER — Encounter: Payer: Self-pay | Admitting: Surgery

## 2021-04-06 ENCOUNTER — Ambulatory Visit (INDEPENDENT_AMBULATORY_CARE_PROVIDER_SITE_OTHER): Payer: Medicare Other | Admitting: Surgery

## 2021-04-06 DIAGNOSIS — Z9889 Other specified postprocedural states: Secondary | ICD-10-CM

## 2021-04-06 NOTE — Progress Notes (Signed)
55 year old white male who is 2 weeks status post BILATERAL LATERAL RECESS DECOMPRESSION/PARTIAL HEMILAMINECTOMIES LUMBAR FOUR-FIVE returns.  States that he is doing very well and pleased with his progress at this point.  He has some residual numbness in his left foot.  Exam Very pleasant white male alert and oriented in no acute distress.  He is ambulating very well.  Low back wound looks good.  Staples removed and Steri-Strips applied.  No drainage or signs of infection.   Plan Patient will gradually increase walking distances over the next several weeks.  Nothing too aggressive too quickly.  Still avoid bending twisting lifting.  Follow-up in 4 weeks with Dr. Louanne Skye for recheck.  Return sooner if needed.

## 2021-04-18 ENCOUNTER — Telehealth: Payer: Self-pay | Admitting: Family Medicine

## 2021-04-18 NOTE — Telephone Encounter (Signed)
Copied from [place] [id number]. Topic: Medicare AWV >> Apr 18, 2021  9:30 AM Cher Nakai R wrote: Reason for CRM:  Left message for patient to call back and schedule Medicare Annual Wellness Visit (AWV) in office.   If unable to come into the office for AWV,  please offer to do virtually or by telephone.  No hx of AWV eligible for AWVI as of  03/12/2021  Please schedule at anytime with [facility] Health Advisor.      40 Minutes appointment   Any questions, please call me at [phone]

## 2021-05-11 ENCOUNTER — Other Ambulatory Visit: Payer: Self-pay

## 2021-05-11 ENCOUNTER — Ambulatory Visit (INDEPENDENT_AMBULATORY_CARE_PROVIDER_SITE_OTHER): Payer: Medicare Other | Admitting: Surgery

## 2021-05-11 ENCOUNTER — Encounter: Payer: Self-pay | Admitting: Surgery

## 2021-05-11 ENCOUNTER — Encounter: Payer: Medicare Other | Admitting: Specialist

## 2021-05-11 VITALS — BP 107/71 | HR 76 | Ht 72.0 in | Wt 272.0 lb

## 2021-05-11 DIAGNOSIS — Z9889 Other specified postprocedural states: Secondary | ICD-10-CM

## 2021-05-11 NOTE — Progress Notes (Signed)
55 year old white male is about 6-week status post open L4-5 decompression returns.  States that it is preop symptoms are greatly improved.  He is able to walk up stairs on his own without any assistance.  Continues to have some numbness in his left foot.  Exam Very pleasant white male alert and oriented in no acute distress.  Surgical incision is well-healed.  Gait is normal.  Negative log about his.  Negative straight leg raise.   Plan Patient will gradually increase activities as tolerated.  Still avoid bending, twisting movements.  Discussed proper lifting techniques.  Follow-up in 6 weeks for final check with Dr. Louanne Skye.  If he is doing well and not having any issues I advised him to call me and leave me a message just before that appointment and he can cancel.

## 2021-06-22 ENCOUNTER — Ambulatory Visit (INDEPENDENT_AMBULATORY_CARE_PROVIDER_SITE_OTHER): Payer: Self-pay | Admitting: Specialist

## 2021-06-22 ENCOUNTER — Other Ambulatory Visit: Payer: Self-pay

## 2021-06-22 ENCOUNTER — Encounter: Payer: Self-pay | Admitting: Specialist

## 2021-06-22 VITALS — BP 175/82 | HR 78 | Ht 72.0 in | Wt 272.0 lb

## 2021-06-22 DIAGNOSIS — G5731 Lesion of lateral popliteal nerve, right lower limb: Secondary | ICD-10-CM

## 2021-06-22 DIAGNOSIS — M4807 Spinal stenosis, lumbosacral region: Secondary | ICD-10-CM

## 2021-06-22 DIAGNOSIS — M25571 Pain in right ankle and joints of right foot: Secondary | ICD-10-CM

## 2021-06-22 DIAGNOSIS — Z9889 Other specified postprocedural states: Secondary | ICD-10-CM

## 2021-06-22 DIAGNOSIS — G589 Mononeuropathy, unspecified: Secondary | ICD-10-CM

## 2021-06-22 DIAGNOSIS — Z981 Arthrodesis status: Secondary | ICD-10-CM

## 2021-06-22 DIAGNOSIS — G8929 Other chronic pain: Secondary | ICD-10-CM

## 2021-06-22 NOTE — Patient Instructions (Signed)
Avoid frequent bending and stooping  No lifting greater than 10 lbs. May use ice or moist heat for pain. Weight loss is of benefit. Best medication for lumbar disc disease is arthritis medications like motrin, celebrex and naprosyn. Exercise is important to improve your indurance and does allow people to function better inspite of back pain.  Low Back Sprain or Strain Rehab Ask your health care provider which exercises are safe for you. Do exercises exactly as told by your health care provider and adjust them as directed. It is normal to feel mild stretching, pulling, tightness, or discomfort as you do these exercises. Stop right away if you feel sudden pain or your pain gets worse. Do not begin these exercises until told by your health care provider. Stretching and range-of-motion exercises These exercises warm up your muscles and joints and improve the movement and flexibility of your back. These exercises also help to relieve pain, numbness,and tingling. Lumbar rotation  Lie on your back on a firm surface and bend your knees. Straighten your arms out to your sides so each arm forms a 90-degree angle (right angle) with a side of your body. Slowly move (rotate) both of your knees to one side of your body until you feel a stretch in your lower back (lumbar). Try not to let your shoulders lift off the floor. Hold this position for __________ seconds. Tense your abdominal muscles and slowly move your knees back to the starting position. Repeat this exercise on the other side of your body. Repeat __________ times. Complete this exercise __________ times a day. Single knee to chest  Lie on your back on a firm surface with both legs straight. Bend one of your knees. Use your hands to move your knee up toward your chest until you feel a gentle stretch in your lower back and buttock. Hold your leg in this position by holding on to the front of your knee. Keep your other leg as straight as  possible. Hold this position for __________ seconds. Slowly return to the starting position. Repeat with your other leg. Repeat __________ times. Complete this exercise __________ times a day. Prone extension on elbows  Lie on your abdomen on a firm surface (prone position). Prop yourself up on your elbows. Use your arms to help lift your chest up until you feel a gentle stretch in your abdomen and your lower back. This will place some of your body weight on your elbows. If this is uncomfortable, try stacking pillows under your chest. Your hips should stay down, against the surface that you are lying on. Keep your hip and back muscles relaxed. Hold this position for __________ seconds. Slowly relax your upper body and return to the starting position. Repeat __________ times. Complete this exercise __________ times a day. Strengthening exercises These exercises build strength and endurance in your back. Endurance is theability to use your muscles for a long time, even after they get tired. Pelvic tilt This exercise strengthens the muscles that lie deep in the abdomen. Lie on your back on a firm surface. Bend your knees and keep your feet flat on the floor. Tense your abdominal muscles. Tip your pelvis up toward the ceiling and flatten your lower back into the floor. To help with this exercise, you may place a small towel under your lower back and try to push your back into the towel. Hold this position for __________ seconds. Let your muscles relax completely before you repeat this exercise. Repeat __________ times. Complete  this exercise __________ times a day. Alternating arm and leg raises  Get on your hands and knees on a firm surface. If you are on a hard floor, you may want to use padding, such as an exercise mat, to cushion your knees. Line up your arms and legs. Your hands should be directly below your shoulders, and your knees should be directly below your hips. Lift your left  leg behind you. At the same time, raise your right arm and straighten it in front of you. Do not lift your leg higher than your hip. Do not lift your arm higher than your shoulder. Keep your abdominal and back muscles tight. Keep your hips facing the ground. Do not arch your back. Keep your balance carefully, and do not hold your breath. Hold this position for __________ seconds. Slowly return to the starting position. Repeat with your right leg and your left arm. Repeat __________ times. Complete this exercise __________ times a day. Abdominal set with straight leg raise  Lie on your back on a firm surface. Bend one of your knees and keep your other leg straight. Tense your abdominal muscles and lift your straight leg up, 4-6 inches (10-15 cm) off the ground. Keep your abdominal muscles tight and hold this position for __________ seconds. Do not hold your breath. Do not arch your back. Keep it flat against the ground. Keep your abdominal muscles tense as you slowly lower your leg back to the starting position. Repeat with your other leg. Repeat __________ times. Complete this exercise __________ times a day. Single leg lower with bent knees Lie on your back on a firm surface. Tense your abdominal muscles and lift your feet off the floor, one foot at a time, so your knees and hips are bent in 90-degree angles (right angles). Your knees should be over your hips and your lower legs should be parallel to the floor. Keeping your abdominal muscles tense and your knee bent, slowly lower one of your legs so your toe touches the ground. Lift your leg back up to return to the starting position. Do not hold your breath. Do not let your back arch. Keep your back flat against the ground. Repeat with your other leg. Repeat __________ times. Complete this exercise __________ times a day. Posture and body mechanics Good posture and healthy body mechanics can help to relieve stress in your body's  tissues and joints. Body mechanics refers to the movements and positions of your body while you do your daily activities. Posture is part of body mechanics. Good posture means: Your spine is in its natural S-curve position (neutral). Your shoulders are pulled back slightly. Your head is not tipped forward. Follow these guidelines to improve your posture and body mechanics in youreveryday activities. Standing  When standing, keep your spine neutral and your feet about hip width apart. Keep a slight bend in your knees. Your ears, shoulders, and hips should line up. When you do a task in which you stand in one place for a long time, place one foot up on a stable object that is 2-4 inches (5-10 cm) high, such as a footstool. This helps keep your spine neutral.  Sitting  When sitting, keep your spine neutral and keep your feet flat on the floor. Use a footrest, if necessary, and keep your thighs parallel to the floor. Avoid rounding your shoulders, and avoid tilting your head forward. When working at a desk or a computer, keep your desk at a height where  your hands are slightly lower than your elbows. Slide your chair under your desk so you are close enough to maintain good posture. When working at a computer, place your monitor at a height where you are looking straight ahead and you do not have to tilt your head forward or downward to look at the screen.  Resting When lying down and resting, avoid positions that are most painful for you. If you have pain with activities such as sitting, bending, stooping, or squatting, lie in a position in which your body does not bend very much. For example, avoid curling up on your side with your arms and knees near your chest (fetal position). If you have pain with activities such as standing for a long time or reaching with your arms, lie with your spine in a neutral position and bend your knees slightly. Try the following positions: Lying on your side with a  pillow between your knees. Lying on your back with a pillow under your knees. Lifting  When lifting objects, keep your feet at least shoulder width apart and tighten your abdominal muscles. Bend your knees and hips and keep your spine neutral. It is important to lift using the strength of your legs, not your back. Do not lock your knees straight out. Always ask for help to lift heavy or awkward objects.  This information is not intended to replace advice given to you by your health care provider. Make sure you discuss any questions you have with your healthcare provider. Document Revised: 02/20/2019 Document Reviewed: 11/20/2018 Elsevier Patient Education  [employer].

## 2021-06-22 NOTE — Progress Notes (Signed)
Post-Op Visit Note   Patient: Adam Hernandez           Date of Birth: Jun 09, 1966           MRN: [id number] Visit Date: 06/22/2021 PCP: Juline Patch, MD   Assessment & Plan: 3 mo post L4-5 bilateral lateral recess decompression  Chief Complaint:  Chief Complaint  Patient presents with   Lower Back - Follow-up    DOS 03/20/21 Had Bilat Lateral Recess Decompression/Partial Hemilaminectomies @ L4-, says that he is doing a lot better.  Pain in the back and left leg is better, some residual numbness left leg. No bowel or bladder difficulty. Motor is normal, normal Heel and toe  Visit Diagnoses:  1. History of lumbar laminectomy for spinal cord decompression   2. Spinal stenosis of lumbosacral region   3. Chronic pain of right ankle   4. Mononeuropathy   5. Entrapment neuropathy of right superficial peroneal nerve   6. S/P cervical spinal fusion     Plan: Avoid frequent bending and stooping  No lifting greater than 10 lbs. May use ice or moist heat for pain. Weight loss is of benefit. Best medication for lumbar disc disease is arthritis medications like motrin, celebrex and naprosyn. Exercise is important to improve your indurance and does allow people to function better inspite of back pain.    Follow-Up Instructions: Return if symptoms worsen or fail to improve.   Orders:  No orders of the defined types were placed in this encounter.  No orders of the defined types were placed in this encounter.   Imaging: No results found.  PMFS History: Patient Active Problem List   Diagnosis Date Noted   Spinal stenosis of lumbar region 03/20/2021    Priority: High    Class: Chronic   Spondylosis without myelopathy or radiculopathy, cervical region 06/22/2019    Priority: Medium    Class: Chronic   Stenosis of lateral recess of lumbar spine 03/20/2021   Herniation of cervical intervertebral disc with radiculopathy 06/22/2019   Status post cervical spinal fusion 06/22/2019    Encounter for screening colonoscopy    Polyp of sigmoid colon    Synovitis and tenosynovitis 01/06/2019   Strain of peroneal tendon 12/23/2018   Closed dislocation of ankle 04/29/2018   Elevated blood pressure reading 09/02/2017   Neuropathy of right ankle 09/02/2017   Obesity (BMI 35.0-39.9 without comorbidity) 09/02/2017   Injury of nerve of lower extremity 09/02/2017   Closed bimalleolar fracture 09/26/2016   Loose body in ankle and foot joint 09/26/2016   Sprain of deltoid ligament of ankle 09/26/2016   Past Medical History:  Diagnosis Date   Anxiety    Arm numbness left    cervical disc issues   Arthritis    right leg   GERD (gastroesophageal reflux disease)    Hypertension    Motion sickness    ocean boats   Neuromuscular disorder ([place])    Neuropathy    Obesity (BMI 35.0-39.9 without comorbidity) 09/02/2017    Family History  Adopted: Yes    Past Surgical History:  Procedure Laterality Date   ANTERIOR CERVICAL DECOMP/DISCECTOMY FUSION N/A 06/22/2019   Procedure: ANTERIOR CERVICAL DISCECTOMY FUSION C5-6 with plates, screws, allograft bone graft, local bone graft and Vivigen;  Surgeon: Jessy Oto, MD;  Location: [facility];  Service: Orthopedics;  Laterality: N/A;   COLONOSCOPY WITH PROPOFOL N/A 04/30/2019   Procedure: COLONOSCOPY WITH BIOPSY;  Surgeon: Lucilla Lame, MD;  Location: [facility];  Service: Endoscopy;  Laterality: N/A;   LEG SURGERY Right 2017, 2018   fractured leg and ankle- has screws and pins   LUMBAR LAMINECTOMY/DECOMPRESSION MICRODISCECTOMY N/A 03/20/2021   Procedure: BILATERAL LATERAL RECESS DECOMPRESSION/PARTIAL HEMILAMINECTOMIES LUMBAR FOUR-FIVE;  Surgeon: Jessy Oto, MD;  Location: [facility];  Service: Orthopedics;  Laterality: N/A;   POLYPECTOMY N/A 04/30/2019   Procedure: POLYPECTOMY;  Surgeon: Lucilla Lame, MD;  Location: [facility];  Service: Endoscopy;  Laterality: N/A;   SHOULDER SURGERY Left 2013   labrial tear   WISDOM  TOOTH EXTRACTION     Social History   Occupational History   Not on file  Tobacco Use   Smoking status: Never   Smokeless tobacco: Never  Vaping Use   Vaping Use: Never used  Substance and Sexual Activity   Alcohol use: Yes    Alcohol/week: 1.0 standard drink    Types: 1 Cans of beer per week    Comment: occasional   Drug use: Never   Sexual activity: Yes

## 2021-06-29 ENCOUNTER — Other Ambulatory Visit: Payer: Self-pay | Admitting: Family Medicine

## 2021-06-29 DIAGNOSIS — E782 Mixed hyperlipidemia: Secondary | ICD-10-CM

## 2021-06-29 DIAGNOSIS — R030 Elevated blood-pressure reading, without diagnosis of hypertension: Secondary | ICD-10-CM

## 2021-07-11 ENCOUNTER — Telehealth: Payer: Self-pay

## 2021-07-11 NOTE — Telephone Encounter (Signed)
Copied from [place] [id number]. Topic: General - Other >> Jul 11, 2021  4:19 PM Yvette Rack wrote: Reason for CRM: Pt reports that he and his children tested positive for Covid. Pt stated that his children are being prescribed medication by their pediatrician and he would like to know if Dr. Ronnald Ramp feels he needs a prescription as well. Pt stated if so he would like the Rx to be sent to [id number] - [place], [place]

## 2021-07-15 ENCOUNTER — Telehealth: Payer: Self-pay | Admitting: Family Medicine

## 2021-07-15 NOTE — Telephone Encounter (Signed)
Copied from [place] [phone] [phone]. Topic: Medicare AWV >> Jul 15, 2021 11:06 AM Cher Nakai R wrote: Reason for CRM:  Left message for patient to call back and schedule Medicare Annual Wellness Visit (AWV) in office.   If unable to come into the office for AWV,  please offer to do virtually or by telephone.  No hx of AWV eligible for AWVI as of 03/12/2021  Please schedule at anytime with [facility] Health Advisor.      40 Minutes appointment   Any questions, please call me at [phone]

## 2021-09-25 ENCOUNTER — Encounter: Payer: Self-pay | Admitting: Specialist

## 2021-09-28 ENCOUNTER — Other Ambulatory Visit: Payer: Self-pay

## 2021-09-28 ENCOUNTER — Encounter: Payer: Self-pay | Admitting: Specialist

## 2021-09-28 ENCOUNTER — Ambulatory Visit: Payer: Medicare Other | Admitting: Specialist

## 2021-09-28 ENCOUNTER — Ambulatory Visit: Payer: Self-pay

## 2021-09-28 VITALS — BP 147/83 | HR 72 | Ht 70.75 in | Wt 260.0 lb

## 2021-09-28 DIAGNOSIS — M4722 Other spondylosis with radiculopathy, cervical region: Secondary | ICD-10-CM

## 2021-09-28 DIAGNOSIS — Z981 Arthrodesis status: Secondary | ICD-10-CM

## 2021-09-28 DIAGNOSIS — M5412 Radiculopathy, cervical region: Secondary | ICD-10-CM

## 2021-09-28 NOTE — Progress Notes (Signed)
Office Visit Note   Patient: Adam Hernandez           Date of Birth: [birth year]           MRN: [id number] Visit Date: 09/28/2021              Requested by: No referring provider defined for this encounter. PCP: No primary care provider on file.   Assessment & Plan: Visit Diagnoses:  1. S/P cervical spinal fusion   2. Cervical radiculopathy   3. Other spondylosis with radiculopathy, cervical region     Plan: Avoid overhead lifting and overhead use of the arms. Do not lift greater than 5 lbs. Adjust head rest in vehicle to prevent hyperextension if rear ended. Take extra precautions to avoid falling. MRi of the cervical spine due to left arm weakness and atrophy.  Follow-Up Instructions: Return in about 3 weeks (around 10/19/2021).   Orders:  Orders Placed This Encounter  Procedures   XR Cervical Spine 2 or 3 views   No orders of the defined types were placed in this encounter.     Procedures: No procedures performed   Clinical Data: No additional findings.   Subjective: Chief Complaint  Patient presents with   Neck - Pain    55 year old right handed male with history of increasing neck pain and radiation into the left arm. He is weak in the left arm. Pain worsened this past late October, he was filming his son's football game and he had to move rapidly to go down a ladder due to his son having had an injury. His son did okay but he began having neck stiffness and pain radiating into the left arm. No bowel or bladder difficulty. Pain is better but he is still weak into the left arm and has numbness into the left thumb.   Review of Systems   Objective: Vital Signs: BP (!) 147/83 (BP Location: Left Arm, Patient Position: Sitting)   Pulse 72   Ht 5' 10.75" (1.797 m)   Wt 260 lb (117.9 kg)   BMI 36.52 kg/m   Physical Exam Constitutional:      Appearance: He is well-developed.  HENT:     Head: Normocephalic and atraumatic.  Eyes:     Pupils: Pupils are  equal, round, and reactive to light.  Pulmonary:     Effort: Pulmonary effort is normal.     Breath sounds: Normal breath sounds.  Abdominal:     General: Bowel sounds are normal.     Palpations: Abdomen is soft.  Musculoskeletal:     Cervical back: Normal range of motion and neck supple.     Lumbar back: Negative right straight leg raise test and negative left straight leg raise test.  Skin:    General: Skin is warm and dry.  Neurological:     Mental Status: He is alert and oriented to person, place, and time.  Psychiatric:        Behavior: Behavior normal.        Thought Content: Thought content normal.        Judgment: Judgment normal.   Back Exam   Tenderness  The patient is experiencing tenderness in the lumbar.  Range of Motion  Extension:  abnormal  Flexion:  abnormal  Lateral bend right:  abnormal  Lateral bend left:  abnormal  Rotation right:  abnormal  Rotation left:  abnormal   Muscle Strength  Right Quadriceps:  5/5  Left Quadriceps:  5/5  Right Hamstrings:  5/5  Left Hamstrings:  5/5   Tests  Straight leg raise right: negative Straight leg raise left: negative  Other  Toe walk: normal Heel walk: normal Sensation: decreased Erythema: no back redness Scars: present  Comments:  Weak left supination forearm with atrophy of the left brachioradialis, weak left finger extension and slight wrist volar flexion and triceps.     Specialty Comments:  No specialty comments available.  Imaging: XR Cervical Spine 2 or 3 views  Result Date: 09/28/2021 AP and lateral radiographs  of the cervical spine demonstrates fusion C5-6 that is solid with retained Plate and screws. There is disc narrowing and anterior osteophyte formation at C6-7    PMFS History: Patient Active Problem List   Diagnosis Date Noted   Spinal stenosis of lumbar region 03/20/2021    Priority: High    Class: Chronic   Spondylosis without myelopathy or radiculopathy, cervical region  06/22/2019    Priority: Medium     Class: Chronic   Stenosis of lateral recess of lumbar spine 03/20/2021   Herniation of cervical intervertebral disc with radiculopathy 06/22/2019   Status post cervical spinal fusion 06/22/2019   Encounter for screening colonoscopy    Polyp of sigmoid colon    Synovitis and tenosynovitis 01/06/2019   Strain of peroneal tendon 12/23/2018   Closed dislocation of ankle 04/29/2018   Elevated blood pressure reading 09/02/2017   Neuropathy of right ankle 09/02/2017   Obesity (BMI 35.0-39.9 without comorbidity) 09/02/2017   Injury of nerve of lower extremity 09/02/2017   Closed bimalleolar fracture 09/26/2016   Loose body in ankle and foot joint 09/26/2016   Sprain of deltoid ligament of ankle 09/26/2016   Past Medical History:  Diagnosis Date   Anxiety    Arm numbness left    cervical disc issues   Arthritis    right leg   GERD (gastroesophageal reflux disease)    Hypertension    Motion sickness    ocean boats   Neuromuscular disorder ([place])    Neuropathy    Obesity (BMI 35.0-39.9 without comorbidity) 09/02/2017    Family History  Adopted: Yes    Past Surgical History:  Procedure Laterality Date   ANTERIOR CERVICAL DECOMP/DISCECTOMY FUSION N/A 06/22/2019   Procedure: ANTERIOR CERVICAL DISCECTOMY FUSION C5-6 with plates, screws, allograft bone graft, local bone graft and Vivigen;  Surgeon: Jessy Oto, MD;  Location: [facility];  Service: Orthopedics;  Laterality: N/A;   COLONOSCOPY WITH PROPOFOL N/A 04/30/2019   Procedure: COLONOSCOPY WITH BIOPSY;  Surgeon: Lucilla Lame, MD;  Location: [facility];  Service: Endoscopy;  Laterality: N/A;   LEG SURGERY Right 2017, 2018   fractured leg and ankle- has screws and pins   LUMBAR LAMINECTOMY/DECOMPRESSION MICRODISCECTOMY N/A 03/20/2021   Procedure: BILATERAL LATERAL RECESS DECOMPRESSION/PARTIAL HEMILAMINECTOMIES LUMBAR FOUR-FIVE;  Surgeon: Jessy Oto, MD;  Location: [facility];  Service: Orthopedics;   Laterality: N/A;   POLYPECTOMY N/A 04/30/2019   Procedure: POLYPECTOMY;  Surgeon: Lucilla Lame, MD;  Location: [facility];  Service: Endoscopy;  Laterality: N/A;   SHOULDER SURGERY Left 2013   labrial tear   WISDOM TOOTH EXTRACTION     Social History   Occupational History   Not on file  Tobacco Use   Smoking status: Never   Smokeless tobacco: Never  Vaping Use   Vaping Use: Never used  Substance and Sexual Activity   Alcohol use: Yes    Alcohol/week: 1.0 standard drink    Types: 1 Cans  of beer per week    Comment: occasional   Drug use: Never   Sexual activity: Yes

## 2021-09-28 NOTE — Patient Instructions (Signed)
Plan: Avoid overhead lifting and overhead use of the arms. Do not lift greater than 5 lbs. Adjust head rest in vehicle to prevent hyperextension if rear ended. Take extra precautions to avoid falling. MRi of the cervical spine due to left arm weakness and atrophy.  Follow-Up Instructions: Return in about 3 weeks (around 10/19/2021).

## 2021-09-29 ENCOUNTER — Telehealth: Payer: Self-pay | Admitting: Specialist

## 2021-09-29 NOTE — Telephone Encounter (Signed)
Terri faxed order yesterday to [facility]

## 2021-09-29 NOTE — Telephone Encounter (Signed)
Pt calling asking if MRI orders can be sent to [facility] in [place]. Pt states he would be unable to drive himself to [facility] for his appt here and sending it to [facility] would allow him to get a ride. The best phone and fax number for the facility is p:[phone] and  f:[phone]. The best call back number for the pt is [phone].

## 2021-10-12 ENCOUNTER — Other Ambulatory Visit: Payer: Self-pay | Admitting: Radiology

## 2021-10-12 ENCOUNTER — Other Ambulatory Visit: Payer: Self-pay | Admitting: Specialist

## 2021-10-12 MED ORDER — HYDROCODONE-ACETAMINOPHEN 5-325 MG PO TABS: 1.0000 | ORAL_TABLET | Freq: Four times a day (QID) | ORAL | 0 refills | Status: DC | PRN

## 2021-10-19 ENCOUNTER — Ambulatory Visit: Payer: Medicare Other | Admitting: Specialist

## 2021-10-19 ENCOUNTER — Other Ambulatory Visit: Payer: Self-pay

## 2021-10-19 ENCOUNTER — Encounter: Payer: Self-pay | Admitting: Specialist

## 2021-10-19 VITALS — BP 141/89 | HR 76 | Ht 70.75 in | Wt 260.0 lb

## 2021-10-19 DIAGNOSIS — Z981 Arthrodesis status: Secondary | ICD-10-CM | POA: Diagnosis not present

## 2021-10-19 DIAGNOSIS — M4722 Other spondylosis with radiculopathy, cervical region: Secondary | ICD-10-CM | POA: Diagnosis not present

## 2021-10-19 DIAGNOSIS — M5412 Radiculopathy, cervical region: Secondary | ICD-10-CM

## 2021-10-19 NOTE — Progress Notes (Signed)
Office Visit Note   Patient: Adam Hernandez           Date of Birth: May 01, 1966           MRN: [id number] Visit Date: 10/19/2021              Requested by: No referring provider defined for this encounter. PCP: No primary care provider on file.   Assessment & Plan: Visit Diagnoses:  1. Other spondylosis with radiculopathy, cervical region   2. Cervical radiculopathy   3. S/P cervical spinal fusion     Plan: Avoid overhead lifting and overhead use of the arms. Do not lift greater than 5 lbs. Adjust head rest in vehicle to prevent hyperextension if rear ended. Take extra precautions to avoid falling, including use of a cane if you feel weak. Scheduling secretary Kandice Hams. will call you to arrange for surgery for your cervical spine. If you wish a second opinion please let us know and we can arrange for you. If you have worsening arm or leg numbness or weakness please call or go to an ER. We will contact your cardiologist and primary care physicians to seek clearance for your surgery. Surgery will be an left posterior foramenotomies at the C4-5 and C6-7 level with decompression of the cervical spinal left side nerve opening at C5 and C7. OR microscope   Risks of surgery include risks of infection, bleeding and risks to the spinal cord and   Surgery is indicated due to upper extremity radiculopathy, lermittes phenomena and left arm pain. In the future surgery at adjacent levels may be necessary but these levels do not appear to be related to your current symptoms or signs.     Follow-Up Instructions: No follow-ups on file.   Orders:  No orders of the defined types were placed in this encounter.  No orders of the defined types were placed in this encounter.     Procedures: No procedures performed   Clinical Data: Findings:  MRI from [facility] MRI affiliated with [facility] 10/15/2021 shows left C4-5 moderate and left C6-7 severe foramenal  stenosis.   Subjective: Chief Complaint  Patient presents with  . Neck - Follow-up    55 year old male right handed with left arm numbness and paresthesia. He is noted to have atrophy of the left forearm and C7 weakness. Previous C5-6 ACDF for HNP. Now with numbness and paresthesias into the left thumb. Previous right ankle injury with persistent paresthesias in the superficial peroneal distribution. Under went recent MRI cervical spine   Review of Systems  Constitutional: Negative.   HENT: Negative.    Eyes: Negative.   Respiratory: Negative.    Cardiovascular: Negative.   Gastrointestinal: Negative.   Endocrine: Negative.   Genitourinary: Negative.   Musculoskeletal: Negative.   Skin: Negative.   Allergic/Immunologic: Negative.   Neurological: Negative.   Hematological: Negative.   Psychiatric/Behavioral: Negative.      Objective: Vital Signs: BP (!) 141/89   Pulse 76   Ht 5' 10.75" (1.797 m)   Wt 260 lb (117.9 kg)   BMI 36.52 kg/m   Physical Exam Constitutional:      Appearance: He is well-developed.  HENT:     Head: Normocephalic and atraumatic.  Eyes:     Pupils: Pupils are equal, round, and reactive to light.  Pulmonary:     Effort: Pulmonary effort is normal.     Breath sounds: Normal breath sounds.  Abdominal:     General: Bowel  sounds are normal.     Palpations: Abdomen is soft.  Musculoskeletal:     Cervical back: Normal range of motion and neck supple.  Skin:    General: Skin is warm and dry.  Neurological:     Mental Status: He is alert and oriented to person, place, and time.  Psychiatric:        Behavior: Behavior normal.        Thought Content: Thought content normal.        Judgment: Judgment normal.   Back Exam   Tenderness  The patient is experiencing tenderness in the cervical.  Range of Motion  Extension:  abnormal  Flexion:  abnormal  Lateral bend right:  normal  Lateral bend left:  abnormal  Rotation right:  normal  Rotation  left:  abnormal   Muscle Strength  Right Quadriceps:  5/5  Left Quadriceps:  5/5  Right Hamstrings:  5/5  Left Hamstrings:  5/5   Comments:  Left C7 weakness finger extension left wrist volar flexion and triceps. Atrophy left forearm 1 inch.     Specialty Comments:  No specialty comments available.  Imaging: No results found.   PMFS History: Patient Active Problem List   Diagnosis Date Noted  . Spinal stenosis of lumbar region 03/20/2021    Priority: High    Class: Chronic  . Spondylosis without myelopathy or radiculopathy, cervical region 06/22/2019    Priority: Medium     Class: Chronic  . Stenosis of lateral recess of lumbar spine 03/20/2021  . Herniation of cervical intervertebral disc with radiculopathy 06/22/2019  . Status post cervical spinal fusion 06/22/2019  . Encounter for screening colonoscopy   . Polyp of sigmoid colon   . Synovitis and tenosynovitis 01/06/2019  . Strain of peroneal tendon 12/23/2018  . Closed dislocation of ankle 04/29/2018  . Elevated blood pressure reading 09/02/2017  . Neuropathy of right ankle 09/02/2017  . Obesity (BMI 35.0-39.9 without comorbidity) 09/02/2017  . Injury of nerve of lower extremity 09/02/2017  . Closed bimalleolar fracture 09/26/2016  . Loose body in ankle and foot joint 09/26/2016  . Sprain of deltoid ligament of ankle 09/26/2016   Past Medical History:  Diagnosis Date  . Anxiety   . Arm numbness left    cervical disc issues  . Arthritis    right leg  . GERD (gastroesophageal reflux disease)   . Hypertension   . Motion sickness    ocean boats  . Neuromuscular disorder ([place])   . Neuropathy   . Obesity (BMI 35.0-39.9 without comorbidity) 09/02/2017    Family History  Adopted: Yes    Past Surgical History:  Procedure Laterality Date  . ANTERIOR CERVICAL DECOMP/DISCECTOMY FUSION N/A 06/22/2019   Procedure: ANTERIOR CERVICAL DISCECTOMY FUSION C5-6 with plates, screws, allograft bone graft, local bone graft  and Vivigen;  Surgeon: Jessy Oto, MD;  Location: [place];  Service: Orthopedics;  Laterality: N/A;  . COLONOSCOPY WITH PROPOFOL N/A 04/30/2019   Procedure: COLONOSCOPY WITH BIOPSY;  Surgeon: Lucilla Lame, MD;  Location: [place];  Service: Endoscopy;  Laterality: N/A;  . LEG SURGERY Right 2017, 2018   fractured leg and ankle- has screws and pins  . LUMBAR LAMINECTOMY/DECOMPRESSION MICRODISCECTOMY N/A 03/20/2021   Procedure: BILATERAL LATERAL RECESS DECOMPRESSION/PARTIAL HEMILAMINECTOMIES LUMBAR FOUR-FIVE;  Surgeon: Jessy Oto, MD;  Location: [place];  Service: Orthopedics;  Laterality: N/A;  . POLYPECTOMY N/A 04/30/2019   Procedure: POLYPECTOMY;  Surgeon: Lucilla Lame, MD;  Location: [place];  Service: Endoscopy;  Laterality: N/A;  . SHOULDER SURGERY Left 2013   labrial tear  . WISDOM TOOTH EXTRACTION     Social History   Occupational History  . Not on file  Tobacco Use  . Smoking status: Never  . Smokeless tobacco: Never  Vaping Use  . Vaping Use: Never used  Substance and Sexual Activity  . Alcohol use: Yes    Alcohol/week: 1.0 standard drink    Types: 1 Cans of beer per week    Comment: occasional  . Drug use: Never  . Sexual activity: Yes

## 2021-10-19 NOTE — Patient Instructions (Signed)
Plan: Avoid overhead lifting and overhead use of the arms. Do not lift greater than 5 lbs. Adjust head rest in vehicle to prevent hyperextension if rear ended. Take extra precautions to avoid falling, including use of a cane if you feel weak. Scheduling secretary Kandice Hams. will call you to arrange for surgery for your cervical spine. If you wish a second opinion please let us know and we can arrange for you. If you have worsening arm or leg numbness or weakness please call or go to an ER. We will contact your cardiologist and primary care physicians to seek clearance for your surgery. Surgery will be an left posterior foramenotomies at the C4-5 and C6-7 level with decompression of the cervical spinal left side nerve opening at C5 and C7. OR microscope   Risks of surgery include risks of infection, bleeding and risks to the spinal cord and   Surgery is indicated due to upper extremity radiculopathy, lermittes phenomena and left arm pain. In the future surgery at adjacent levels may be necessary but these levels do not appear to be related to your current symptoms or signs.

## 2021-10-26 ENCOUNTER — Encounter: Payer: Self-pay | Admitting: Specialist

## 2021-11-01 ENCOUNTER — Encounter: Payer: Self-pay | Admitting: Specialist

## 2021-11-07 ENCOUNTER — Other Ambulatory Visit: Payer: Self-pay

## 2021-11-15 NOTE — Pre-Procedure Instructions (Signed)
Surgical Instructions    Your procedure is scheduled on Tuesday, January 10th.  Report to [facility] Main Entrance "A" at 05:30 A.M., then check in with the Admitting office.  Call this number if you have problems the morning of surgery:  [phone]   If you have any questions prior to your surgery date call [phone]: Open Monday-Friday 8am-4pm    Remember:  Do not eat after midnight the night before your surgery  You may drink clear liquids until 04:30 AM the morning of your surgery.   Clear liquids allowed are: Water, Non-Citrus Juices (without pulp), Carbonated Beverages, Clear Tea, Black Coffee Only, and Gatorade  Patient Instructions  The night before surgery:  No food after midnight. ONLY clear liquids after midnight  The day of surgery (if you do NOT have diabetes):  Drink ONE (1) Pre-Surgery Clear Ensure by 04:30 AM the morning of surgery. Drink in one sitting. Do not sip.  This drink was given to you during your hospital  pre-op appointment visit.  Nothing else to drink after completing the  Pre-Surgery Clear Ensure.         If you have questions, please contact your surgeons office.     Take these medicines the morning of surgery with A SIP OF WATER  atorvastatin (LIPITOR)  cetirizine (ZYRTEC)  gabapentin (NEURONTIN)   If needed: acetaminophen (TYLENOL)  HYDROcodone-acetaminophen (NORCO/VICODIN)  traMADol (ULTRAM)  As of today, STOP taking any Aspirin (unless otherwise instructed by your surgeon) Aleve, Naproxen, Ibuprofen, Motrin, Advil, Goody's, BC's, all herbal medications, fish oil, and all vitamins. This includes your celecoxib (CELEBREX).                     Do NOT Smoke (Tobacco/Vaping) or drink Alcohol 24 hours prior to your procedure.  If you use a CPAP at night, you may bring all equipment for your overnight stay.   Contacts, glasses, piercing's, hearing aid's, dentures or partials may not be worn into surgery, please bring cases for these  belongings.    For patients admitted to the hospital, discharge time will be determined by your treatment team.   Patients discharged the day of surgery will not be allowed to drive home, and someone needs to stay with them for 24 hours.  NO VISITORS WILL BE ALLOWED IN PRE-OP WHERE PATIENTS GET READY FOR SURGERY.  ONLY 1 SUPPORT PERSON MAY BE PRESENT IN THE WAITING ROOM WHILE YOU ARE IN SURGERY.  IF YOU ARE TO BE ADMITTED, ONCE YOU ARE IN YOUR ROOM YOU WILL BE ALLOWED TWO (2) VISITORS.  Minor children may have two parents present. Special consideration for safety and communication needs will be reviewed on a case by case basis.   Special instructions:   [facility]- Preparing For Surgery  Before surgery, you can play an important role. Because skin is not sterile, your skin needs to be as free of germs as possible. You can reduce the number of germs on your skin by washing with CHG (chlorahexidine gluconate) Soap before surgery.  CHG is an antiseptic cleaner which kills germs and bonds with the skin to continue killing germs even after washing.    Oral Hygiene is also important to reduce your risk of infection.  Remember - BRUSH YOUR TEETH THE MORNING OF SURGERY WITH YOUR REGULAR TOOTHPASTE  Please do not use if you have an allergy to CHG or antibacterial soaps. If your skin becomes reddened/irritated stop using the CHG.  Do not shave (including legs  and underarms) for at least 48 hours prior to first CHG shower. It is OK to shave your face.  Please follow these instructions carefully.   Shower the NIGHT BEFORE SURGERY and the MORNING OF SURGERY  If you chose to wash your hair, wash your hair first as usual with your normal shampoo.  After you shampoo, rinse your hair and body thoroughly to remove the shampoo.  Use CHG Soap as you would any other liquid soap. You can apply CHG directly to the skin and wash gently with a scrungie or a clean washcloth.   Apply the CHG Soap to your body  ONLY FROM THE NECK DOWN.  Do not use on open wounds or open sores. Avoid contact with your eyes, ears, mouth and genitals (private parts). Wash Face and genitals (private parts)  with your normal soap.   Wash thoroughly, paying special attention to the area where your surgery will be performed.  Thoroughly rinse your body with warm water from the neck down.  DO NOT shower/wash with your normal soap after using and rinsing off the CHG Soap.  Pat yourself dry with a CLEAN TOWEL.  Wear CLEAN PAJAMAS to bed the night before surgery  Place CLEAN SHEETS on your bed the night before your surgery  DO NOT SLEEP WITH PETS.   Day of Surgery: Shower with CHG soap. Do not wear jewelry Do not wear lotions, powders, colognes, or deodorant. Men may shave face and neck. Do not bring valuables to the hospital. [facility] is not responsible for any belongings or valuables. Wear Clean/Comfortable clothing the morning of surgery Remember to brush your teeth WITH YOUR REGULAR TOOTHPASTE.   Please read over the following fact sheets that you were given.   3 days prior to your procedure or After your COVID test   You are not required to quarantine however you are required to wear a well-fitting mask when you are out and around people not in your household. If your mask becomes wet or soiled, replace with a new one.   Wash your hands often with soap and water for 20 seconds or clean your hands with an alcohol-based hand sanitizer that contains at least 60% alcohol.   Do not share personal items.   Notify your provider:  o if you are in close contact with someone who has COVID  o or if you develop a fever of 100.4 or greater, sneezing, cough, sore throat, shortness of breath or body aches.

## 2021-11-16 ENCOUNTER — Encounter (HOSPITAL_COMMUNITY)
Admission: RE | Admit: 2021-11-16 | Discharge: 2021-11-16 | Disposition: A | Discharge status: Home / Self Care | Payer: Medicare Other | Source: Ambulatory Visit | Attending: Specialist | Admitting: Specialist

## 2021-11-16 ENCOUNTER — Encounter: Payer: Self-pay | Admitting: Surgery

## 2021-11-16 ENCOUNTER — Other Ambulatory Visit: Payer: Self-pay

## 2021-11-16 ENCOUNTER — Encounter (HOSPITAL_COMMUNITY): Payer: Self-pay

## 2021-11-16 ENCOUNTER — Ambulatory Visit (INDEPENDENT_AMBULATORY_CARE_PROVIDER_SITE_OTHER): Payer: Medicare Other | Admitting: Surgery

## 2021-11-16 VITALS — BP 110/72 | HR 64 | Ht 70.75 in | Wt 260.0 lb

## 2021-11-16 DIAGNOSIS — Z01818 Encounter for other preprocedural examination: Secondary | ICD-10-CM | POA: Diagnosis not present

## 2021-11-16 DIAGNOSIS — M5412 Radiculopathy, cervical region: Secondary | ICD-10-CM

## 2021-11-16 LAB — COMPREHENSIVE METABOLIC PANEL
ALT: 28 U/L (ref 0–44)
AST: 22 U/L (ref 15–41)
Albumin: 3.8 g/dL (ref 3.5–5.0)
Alkaline Phosphatase: 79 U/L (ref 38–126)
Anion gap: 8 (ref 5–15)
BUN: 14 mg/dL (ref 6–20)
CO2: 26 mmol/L (ref 22–32)
Calcium: 8.9 mg/dL (ref 8.9–10.3)
Chloride: 103 mmol/L (ref 98–111)
Creatinine, Ser: 1.25 mg/dL — ABNORMAL HIGH (ref 0.61–1.24)
GFR, Estimated: >60 mL/min (ref >60)
Glucose, Bld: 112 mg/dL — ABNORMAL HIGH (ref 70–99)
Potassium: 4 mmol/L (ref 3.5–5.1)
Sodium: 137 mmol/L (ref 135–145)
Total Bilirubin: 0.8 mg/dL (ref 0.3–1.2)
Total Protein: 7.3 g/dL (ref 6.5–8.1)

## 2021-11-16 LAB — PROTIME-INR
INR: 1 (ref 0.8–1.2)
Prothrombin Time: 12.7 seconds (ref 11.4–15.2)

## 2021-11-16 LAB — CBC
HCT: 46.2 % (ref 39.0–52.0)
Hemoglobin: 15.1 g/dL (ref 13.0–17.0)
MCH: 29.4 pg (ref 26.0–34.0)
MCHC: 32.7 g/dL (ref 30.0–36.0)
MCV: 90.1 fL (ref 80.0–100.0)
Platelets: 282 10*3/uL (ref 150–400)
RBC: 5.13 MIL/uL (ref 4.22–5.81)
RDW: 12.9 % (ref 11.5–15.5)
WBC: 8.2 10*3/uL (ref 4.0–10.5)
nRBC: 0 % (ref 0.0–0.2)

## 2021-11-16 LAB — SURGICAL PCR SCREEN
MRSA, PCR: NEGATIVE
Staphylococcus aureus: NEGATIVE

## 2021-11-16 NOTE — Progress Notes (Signed)
°   11/16/21 1050  OBSTRUCTIVE SLEEP APNEA  Have you ever been diagnosed with sleep apnea through a sleep study? No  Do you snore loudly (loud enough to be heard through closed doors)?  1  Do you often feel tired, fatigued, or sleepy during the daytime (such as falling asleep during driving or talking to someone)? 0  Has anyone observed you stop breathing during your sleep? 0  Do you have, or are you being treated for high blood pressure? 1  BMI more than 35 kg/m2? 1  Age > 50 (1-yes) 1  Neck circumference greater than:Male 16 inches or larger, Male 17inches or larger? 1  Male Gender (Yes=1) 1  Obstructive Sleep Apnea Score 6  Score 5 or greater  Results sent to PCP

## 2021-11-16 NOTE — Progress Notes (Signed)
56 year old white male with history of left C4-5 and left C6-7 foraminal stenosis comes in for preop evaluation.  States that neck pain and left upper extremity radiculopathy unchanged from previous visit.  He is wanting to proceed with left C4-5 and left C6-7 foraminotomies as scheduled.  Today history and physical performed.  Review of systems negative.  Surgical procedure previously discussed.  All questions answered.

## 2021-11-16 NOTE — Progress Notes (Signed)
PCP - Dr. Juanda Bond Cardiologist - denies  PPM/ICD - denies   Chest x-ray - 07/20/21 at [facility] (XR Ribs left w/ PA chest) EKG - 11/16/21 at PAT Stress Test - pt states he had one  over 20 years ago, no abnormalities ECHO - denies Cardiac Cath - denies  Sleep Study - denies   DM- denies  Blood Thinner Instructions: n/a Aspirin Instructions: n/a  ERAS Protcol - yes PRE-SURGERY Ensure given at PAT  COVID TEST- pt scheduled for testing at [facility] 11/17/21   Anesthesia review: no  Patient denies shortness of breath, fever, cough and chest pain at PAT appointment   All instructions explained to the patient, with a verbal understanding of the material. Patient agrees to go over the instructions while at home for a better understanding. Patient also instructed to wear a mask in public after being tested for COVID-19. The opportunity to ask questions was provided.

## 2021-11-17 ENCOUNTER — Ambulatory Visit: Payer: Medicare Other | Admitting: Specialist

## 2021-11-17 ENCOUNTER — Other Ambulatory Visit
Admission: RE | Admit: 2021-11-17 | Discharge: 2021-11-17 | Disposition: A | Discharge status: Home / Self Care | Payer: Medicare Other | Source: Ambulatory Visit | Attending: Specialist | Admitting: Specialist

## 2021-11-17 DIAGNOSIS — Z20822 Contact with and (suspected) exposure to covid-19: Secondary | ICD-10-CM | POA: Insufficient documentation

## 2021-11-17 DIAGNOSIS — Z01812 Encounter for preprocedural laboratory examination: Secondary | ICD-10-CM | POA: Insufficient documentation

## 2021-11-18 LAB — SARS CORONAVIRUS 2 (TAT 6-24 HRS): SARS Coronavirus 2: NEGATIVE

## 2021-11-21 ENCOUNTER — Ambulatory Visit (HOSPITAL_COMMUNITY)
Admission: RE | Disposition: A | Discharge status: Home / Self Care | Payer: Self-pay | Source: Home / Self Care | Attending: Specialist

## 2021-11-21 ENCOUNTER — Ambulatory Visit (HOSPITAL_COMMUNITY): Payer: Medicare Other

## 2021-11-21 ENCOUNTER — Other Ambulatory Visit: Payer: Self-pay

## 2021-11-21 ENCOUNTER — Ambulatory Visit (HOSPITAL_COMMUNITY): Payer: Medicare Other | Admitting: Anesthesiology

## 2021-11-21 ENCOUNTER — Observation Stay (HOSPITAL_COMMUNITY)
Admission: RE | Admit: 2021-11-21 | Discharge: 2021-11-22 | Disposition: A | Discharge status: Home / Self Care | Payer: Medicare Other | Attending: Specialist | Admitting: Specialist

## 2021-11-21 ENCOUNTER — Encounter (HOSPITAL_COMMUNITY): Payer: Self-pay | Admitting: Specialist

## 2021-11-21 ENCOUNTER — Ambulatory Visit (HOSPITAL_COMMUNITY): Payer: Medicare Other | Admitting: Physician Assistant

## 2021-11-21 DIAGNOSIS — M4722 Other spondylosis with radiculopathy, cervical region: Secondary | ICD-10-CM

## 2021-11-21 DIAGNOSIS — Z419 Encounter for procedure for purposes other than remedying health state, unspecified: Secondary | ICD-10-CM

## 2021-11-21 DIAGNOSIS — I10 Essential (primary) hypertension: Secondary | ICD-10-CM | POA: Diagnosis not present

## 2021-11-21 DIAGNOSIS — M4802 Spinal stenosis, cervical region: Principal | ICD-10-CM | POA: Insufficient documentation

## 2021-11-21 DIAGNOSIS — Z79899 Other long term (current) drug therapy: Secondary | ICD-10-CM | POA: Diagnosis not present

## 2021-11-21 DIAGNOSIS — Z01818 Encounter for other preprocedural examination: Secondary | ICD-10-CM

## 2021-11-21 HISTORY — PX: POSTERIOR CERVICAL FUSION/FORAMINOTOMY: SHX5038

## 2021-11-21 SURGERY — POSTERIOR CERVICAL FUSION/FORAMINOTOMY LEVEL 2
Anesthesia: General | Site: Spine Cervical

## 2021-11-21 MED ORDER — SODIUM CHLORIDE 0.9% FLUSH
3.0000 mL | Freq: Two times a day (BID) | INTRAVENOUS | Status: DC
  Administered 2021-11-21 (×2): 3 mL via INTRAVENOUS

## 2021-11-21 MED ORDER — OXYCODONE HCL 5 MG/5ML PO SOLN: 5.0000 mg | Freq: Once | ORAL | Status: DC | PRN

## 2021-11-21 MED ORDER — 0.9 % SODIUM CHLORIDE (POUR BTL) OPTIME
TOPICAL | Status: DC | PRN
  Administered 2021-11-21: 1000 mL

## 2021-11-21 MED ORDER — DEXAMETHASONE SODIUM PHOSPHATE 10 MG/ML IJ SOLN
INTRAMUSCULAR | Status: DC | PRN
Start: 2021-11-21 — End: 2021-11-21
  Administered 2021-11-21: 10 mg via INTRAVENOUS

## 2021-11-21 MED ORDER — ALUM & MAG HYDROXIDE-SIMETH 200-200-20 MG/5ML PO SUSP: 30.0000 mL | Freq: Four times a day (QID) | ORAL | Status: DC | PRN

## 2021-11-21 MED ORDER — METHOCARBAMOL 500 MG PO TABS
500.0000 mg | ORAL_TABLET | Freq: Four times a day (QID) | ORAL | Status: DC | PRN
Start: 2021-11-21 — End: 2021-11-22
  Administered 2021-11-21 – 2021-11-22 (×2): 500 mg via ORAL
  Filled 2021-11-21 (×2): qty 1

## 2021-11-21 MED ORDER — PHENYLEPHRINE 40 MCG/ML (10ML) SYRINGE FOR IV PUSH (FOR BLOOD PRESSURE SUPPORT)
PREFILLED_SYRINGE | INTRAVENOUS | Status: AC
  Filled 2021-11-21: qty 10

## 2021-11-21 MED ORDER — DOCUSATE SODIUM 100 MG PO CAPS
100.0000 mg | ORAL_CAPSULE | Freq: Two times a day (BID) | ORAL | Status: DC
  Administered 2021-11-21: 100 mg via ORAL
  Filled 2021-11-21: qty 1

## 2021-11-21 MED ORDER — SUGAMMADEX SODIUM 200 MG/2ML IV SOLN
INTRAVENOUS | Status: DC | PRN
Start: 2021-11-21 — End: 2021-11-21
  Administered 2021-11-21: 200 mg via INTRAVENOUS

## 2021-11-21 MED ORDER — CEFAZOLIN IN SODIUM CHLORIDE 3-0.9 GM/100ML-% IV SOLN
3.0000 g | INTRAVENOUS | Status: AC
  Administered 2021-11-21: 3 g via INTRAVENOUS
  Filled 2021-11-21: qty 100

## 2021-11-21 MED ORDER — ZOLPIDEM TARTRATE 5 MG PO TABS: 5.0000 mg | ORAL_TABLET | Freq: Every evening | ORAL | Status: DC | PRN

## 2021-11-21 MED ORDER — SUCCINYLCHOLINE CHLORIDE 200 MG/10ML IV SOSY
PREFILLED_SYRINGE | INTRAVENOUS | Status: AC
  Filled 2021-11-21: qty 10

## 2021-11-21 MED ORDER — CHLORHEXIDINE GLUCONATE 0.12 % MT SOLN
OROMUCOSAL | Status: AC
  Administered 2021-11-21: 15 mL
  Filled 2021-11-21: qty 15

## 2021-11-21 MED ORDER — SODIUM CHLORIDE 0.9 % IV SOLN: INTRAVENOUS | Status: DC

## 2021-11-21 MED ORDER — B-12 250 MCG PO TABS: ORAL_TABLET | Freq: Every day | ORAL | Status: DC

## 2021-11-21 MED ORDER — DEXAMETHASONE SODIUM PHOSPHATE 10 MG/ML IJ SOLN
INTRAMUSCULAR | Status: AC
  Filled 2021-11-21: qty 1

## 2021-11-21 MED ORDER — BISACODYL 5 MG PO TBEC: 5.0000 mg | DELAYED_RELEASE_TABLET | Freq: Every day | ORAL | Status: DC | PRN

## 2021-11-21 MED ORDER — OXYCODONE HCL 5 MG PO TABS: 5.0000 mg | ORAL_TABLET | Freq: Once | ORAL | Status: DC | PRN

## 2021-11-21 MED ORDER — HYDROCODONE-ACETAMINOPHEN 7.5-325 MG PO TABS
1.0000 | ORAL_TABLET | Freq: Four times a day (QID) | ORAL | Status: DC
  Administered 2021-11-21 – 2021-11-22 (×4): 1 via ORAL
  Filled 2021-11-21 (×4): qty 1

## 2021-11-21 MED ORDER — ACETAMINOPHEN 650 MG RE SUPP: 650.0000 mg | RECTAL | Status: DC | PRN

## 2021-11-21 MED ORDER — SODIUM CHLORIDE 0.9 % IV SOLN: 250.0000 mL | INTRAVENOUS | Status: DC

## 2021-11-21 MED ORDER — POLYETHYLENE GLYCOL 3350 17 G PO PACK: 17.0000 g | PACK | Freq: Every day | ORAL | Status: DC | PRN

## 2021-11-21 MED ORDER — PROPOFOL 10 MG/ML IV BOLUS
INTRAVENOUS | Status: AC
  Filled 2021-11-21: qty 20

## 2021-11-21 MED ORDER — PROMETHAZINE HCL 25 MG/ML IJ SOLN: 6.2500 mg | INTRAMUSCULAR | Status: DC | PRN

## 2021-11-21 MED ORDER — EPHEDRINE SULFATE-NACL 50-0.9 MG/10ML-% IV SOSY
PREFILLED_SYRINGE | INTRAVENOUS | Status: DC | PRN
  Administered 2021-11-21: 10 mg via INTRAVENOUS
  Administered 2021-11-21 (×3): 5 mg via INTRAVENOUS

## 2021-11-21 MED ORDER — MENTHOL 3 MG MT LOZG: 1.0000 | LOZENGE | OROMUCOSAL | Status: DC | PRN

## 2021-11-21 MED ORDER — HYDROMORPHONE HCL 1 MG/ML IJ SOLN
0.2500 mg | INTRAMUSCULAR | Status: DC | PRN
  Administered 2021-11-21 (×2): 0.5 mg via INTRAVENOUS

## 2021-11-21 MED ORDER — SODIUM CHLORIDE 0.9% FLUSH: 3.0000 mL | INTRAVENOUS | Status: DC | PRN

## 2021-11-21 MED ORDER — VITAMIN D 25 MCG (1000 UNIT) PO TABS: 1000.0000 [IU] | ORAL_TABLET | Freq: Every day | ORAL | Status: DC

## 2021-11-21 MED ORDER — DEXTROSE 5 % IV SOLN: INTRAVENOUS | Status: DC | PRN

## 2021-11-21 MED ORDER — LIDOCAINE 2% (20 MG/ML) 5 ML SYRINGE
INTRAMUSCULAR | Status: AC
  Filled 2021-11-21: qty 5

## 2021-11-21 MED ORDER — FENTANYL CITRATE (PF) 250 MCG/5ML IJ SOLN
INTRAMUSCULAR | Status: DC | PRN
  Administered 2021-11-21: 50 ug via INTRAVENOUS
  Administered 2021-11-21: 100 ug via INTRAVENOUS
  Administered 2021-11-21 (×2): 50 ug via INTRAVENOUS

## 2021-11-21 MED ORDER — CELECOXIB 200 MG PO CAPS: 200.0000 mg | ORAL_CAPSULE | Freq: Every day | ORAL | Status: DC

## 2021-11-21 MED ORDER — ONDANSETRON HCL 4 MG/2ML IJ SOLN
INTRAMUSCULAR | Status: AC
  Filled 2021-11-21: qty 2

## 2021-11-21 MED ORDER — ONDANSETRON HCL 4 MG/2ML IJ SOLN
INTRAMUSCULAR | Status: DC | PRN
  Administered 2021-11-21: 4 mg via INTRAVENOUS

## 2021-11-21 MED ORDER — B-6 250 MG PO TABS: ORAL_TABLET | Freq: Every day | ORAL | Status: DC

## 2021-11-21 MED ORDER — ACETAMINOPHEN 325 MG PO TABS: 650.0000 mg | ORAL_TABLET | ORAL | Status: DC | PRN

## 2021-11-21 MED ORDER — FENTANYL CITRATE (PF) 250 MCG/5ML IJ SOLN
INTRAMUSCULAR | Status: AC
  Filled 2021-11-21: qty 5

## 2021-11-21 MED ORDER — MIDAZOLAM HCL 2 MG/2ML IJ SOLN
INTRAMUSCULAR | Status: DC | PRN
  Administered 2021-11-21: 2 mg via INTRAVENOUS

## 2021-11-21 MED ORDER — FLEET ENEMA 7-19 GM/118ML RE ENEM: 1.0000 | ENEMA | Freq: Once | RECTAL | Status: DC | PRN

## 2021-11-21 MED ORDER — PHENYLEPHRINE HCL-NACL 20-0.9 MG/250ML-% IV SOLN
INTRAVENOUS | Status: DC | PRN
Start: 2021-11-21 — End: 2021-11-21
  Administered 2021-11-21: 100 ug/min via INTRAVENOUS

## 2021-11-21 MED ORDER — MIDAZOLAM HCL 2 MG/2ML IJ SOLN
INTRAMUSCULAR | Status: AC
  Filled 2021-11-21: qty 2

## 2021-11-21 MED ORDER — THROMBIN 20000 UNITS EX SOLR
CUTANEOUS | Status: AC
  Filled 2021-11-21: qty 20000

## 2021-11-21 MED ORDER — HYDROCODONE-ACETAMINOPHEN 7.5-325 MG PO TABS
1.0000 | ORAL_TABLET | ORAL | Status: DC | PRN
  Administered 2021-11-21: 1 via ORAL
  Filled 2021-11-21: qty 1

## 2021-11-21 MED ORDER — GABAPENTIN 400 MG PO CAPS
800.0000 mg | ORAL_CAPSULE | Freq: Three times a day (TID) | ORAL | Status: DC
  Administered 2021-11-21 (×2): 800 mg via ORAL
  Filled 2021-11-21 (×2): qty 2

## 2021-11-21 MED ORDER — CEFAZOLIN SODIUM-DEXTROSE 2-4 GM/100ML-% IV SOLN
2.0000 g | Freq: Three times a day (TID) | INTRAVENOUS | Status: AC
  Administered 2021-11-21 – 2021-11-22 (×2): 2 g via INTRAVENOUS
  Filled 2021-11-21 (×2): qty 100

## 2021-11-21 MED ORDER — METHOCARBAMOL 1000 MG/10ML IJ SOLN
500.0000 mg | Freq: Four times a day (QID) | INTRAVENOUS | Status: DC | PRN
  Filled 2021-11-21: qty 5

## 2021-11-21 MED ORDER — LACTATED RINGERS IV SOLN: INTRAVENOUS | Status: DC | PRN

## 2021-11-21 MED ORDER — BUPIVACAINE-EPINEPHRINE 0.5% -1:200000 IJ SOLN
INTRAMUSCULAR | Status: AC
  Filled 2021-11-21: qty 1

## 2021-11-21 MED ORDER — SUCCINYLCHOLINE CHLORIDE 200 MG/10ML IV SOSY
PREFILLED_SYRINGE | INTRAVENOUS | Status: DC | PRN
  Administered 2021-11-21: 140 mg via INTRAVENOUS

## 2021-11-21 MED ORDER — LISINOPRIL 10 MG PO TABS
10.0000 mg | ORAL_TABLET | Freq: Every day | ORAL | Status: DC
Start: 2021-11-21 — End: 2021-11-22
  Administered 2021-11-21: 10 mg via ORAL
  Filled 2021-11-21: qty 1

## 2021-11-21 MED ORDER — ROCURONIUM BROMIDE 10 MG/ML (PF) SYRINGE
PREFILLED_SYRINGE | INTRAVENOUS | Status: AC
  Filled 2021-11-21: qty 10

## 2021-11-21 MED ORDER — BUPIVACAINE LIPOSOME 1.3 % IJ SUSP
INTRAMUSCULAR | Status: AC
  Filled 2021-11-21: qty 20

## 2021-11-21 MED ORDER — BUPIVACAINE LIPOSOME 1.3 % IJ SUSP
INTRAMUSCULAR | Status: DC | PRN
  Administered 2021-11-21: 5 mL

## 2021-11-21 MED ORDER — ACETAMINOPHEN 10 MG/ML IV SOLN
1000.0000 mg | Freq: Once | INTRAVENOUS | Status: DC | PRN
  Administered 2021-11-21: 1000 mg via INTRAVENOUS

## 2021-11-21 MED ORDER — EPHEDRINE 5 MG/ML INJ
INTRAVENOUS | Status: AC
  Filled 2021-11-21: qty 5

## 2021-11-21 MED ORDER — LORATADINE 10 MG PO TABS: 10.0000 mg | ORAL_TABLET | Freq: Every day | ORAL | Status: DC

## 2021-11-21 MED ORDER — PROPOFOL 10 MG/ML IV BOLUS
INTRAVENOUS | Status: DC | PRN
  Administered 2021-11-21: 200 mg via INTRAVENOUS

## 2021-11-21 MED ORDER — PANTOPRAZOLE SODIUM 40 MG IV SOLR
40.0000 mg | Freq: Every day | INTRAVENOUS | Status: DC
  Administered 2021-11-21: 40 mg via INTRAVENOUS
  Filled 2021-11-21: qty 40

## 2021-11-21 MED ORDER — LIDOCAINE 2% (20 MG/ML) 5 ML SYRINGE
INTRAMUSCULAR | Status: DC | PRN
  Administered 2021-11-21: 100 mg via INTRAVENOUS

## 2021-11-21 MED ORDER — ONDANSETRON HCL 4 MG/2ML IJ SOLN: 4.0000 mg | Freq: Four times a day (QID) | INTRAMUSCULAR | Status: DC | PRN

## 2021-11-21 MED ORDER — HYDROCODONE-ACETAMINOPHEN 7.5-325 MG PO TABS: 2.0000 | ORAL_TABLET | ORAL | Status: DC | PRN

## 2021-11-21 MED ORDER — BUPIVACAINE-EPINEPHRINE 0.5% -1:200000 IJ SOLN
INTRAMUSCULAR | Status: DC | PRN
  Administered 2021-11-21: 5 mL

## 2021-11-21 MED ORDER — ONDANSETRON HCL 4 MG PO TABS: 4.0000 mg | ORAL_TABLET | Freq: Four times a day (QID) | ORAL | Status: DC | PRN

## 2021-11-21 MED ORDER — PHENYLEPHRINE 40 MCG/ML (10ML) SYRINGE FOR IV PUSH (FOR BLOOD PRESSURE SUPPORT)
PREFILLED_SYRINGE | INTRAVENOUS | Status: DC | PRN
  Administered 2021-11-21 (×5): 80 ug via INTRAVENOUS

## 2021-11-21 MED ORDER — HYDROMORPHONE HCL 1 MG/ML IJ SOLN
INTRAMUSCULAR | Status: AC
  Filled 2021-11-21: qty 1

## 2021-11-21 MED ORDER — KETOROLAC TROMETHAMINE 15 MG/ML IJ SOLN
15.0000 mg | Freq: Four times a day (QID) | INTRAMUSCULAR | Status: AC
  Administered 2021-11-21 – 2021-11-22 (×4): 15 mg via INTRAVENOUS
  Filled 2021-11-21 (×4): qty 1

## 2021-11-21 MED ORDER — ACETAMINOPHEN 10 MG/ML IV SOLN
INTRAVENOUS | Status: AC
  Filled 2021-11-21: qty 100

## 2021-11-21 MED ORDER — GLYCOPYRROLATE PF 0.2 MG/ML IJ SOSY
PREFILLED_SYRINGE | INTRAMUSCULAR | Status: DC | PRN
  Administered 2021-11-21: .2 mg via INTRAVENOUS

## 2021-11-21 MED ORDER — BUPIVACAINE LIPOSOME 1.3 % IJ SUSP: 10.0000 mL | Freq: Once | INTRAMUSCULAR | Status: DC

## 2021-11-21 MED ORDER — ATORVASTATIN CALCIUM 10 MG PO TABS
10.0000 mg | ORAL_TABLET | Freq: Every day | ORAL | Status: DC
Start: 2021-11-21 — End: 2021-11-22
  Administered 2021-11-21: 10 mg via ORAL
  Filled 2021-11-21: qty 1

## 2021-11-21 MED ORDER — MORPHINE SULFATE (PF) 2 MG/ML IV SOLN: 2.0000 mg | INTRAVENOUS | Status: DC | PRN

## 2021-11-21 MED ORDER — PHENOL 1.4 % MT LIQD: 1.0000 | OROMUCOSAL | Status: DC | PRN

## 2021-11-21 MED ORDER — ROCURONIUM BROMIDE 10 MG/ML (PF) SYRINGE
PREFILLED_SYRINGE | INTRAVENOUS | Status: DC | PRN
  Administered 2021-11-21 (×2): 50 mg via INTRAVENOUS

## 2021-11-21 SURGICAL SUPPLY — 55 items
BAG COUNTER SPONGE SURGICOUNT (BAG) ×2 IMPLANT
BIT DRILL NEURO 2X3.1 SFT TUCH (MISCELLANEOUS) ×1 IMPLANT
BLADE CLIPPER SURG (BLADE) IMPLANT
BUR RND FLUTED 2.5 (BURR) ×1 IMPLANT
COLLAR CERV LO CONTOUR FIRM DE (SOFTGOODS) ×2 IMPLANT
COVER SURGICAL LIGHT HANDLE (MISCELLANEOUS) ×2 IMPLANT
DERMABOND ADVANCED (GAUZE/BANDAGES/DRESSINGS)
DERMABOND ADVANCED .7 DNX12 (GAUZE/BANDAGES/DRESSINGS) ×1 IMPLANT
DRAPE C-ARM 42X72 X-RAY (DRAPES) ×2 IMPLANT
DRAPE HALF SHEET 40X57 (DRAPES) ×4 IMPLANT
DRAPE LAPAROTOMY 100X72 PEDS (DRAPES) ×2 IMPLANT
DRAPE MICROSCOPE LEICA (MISCELLANEOUS) ×1 IMPLANT
DRAPE SURG 17X23 STRL (DRAPES) ×8 IMPLANT
DRILL NEURO 2X3.1 SOFT TOUCH (MISCELLANEOUS) ×2
DRSG MEPILEX BORDER 4X4 (GAUZE/BANDAGES/DRESSINGS) IMPLANT
DRSG MEPILEX BORDER 4X8 (GAUZE/BANDAGES/DRESSINGS) IMPLANT
DRSG XEROFORM 1X8 (GAUZE/BANDAGES/DRESSINGS) ×1 IMPLANT
DURAPREP 6ML APPLICATOR 50/CS (WOUND CARE) ×2 IMPLANT
ELECT CAUTERY BLADE 6.4 (BLADE) ×2 IMPLANT
ELECT REM PT RETURN 9FT ADLT (ELECTROSURGICAL) ×2
ELECTRODE REM PT RTRN 9FT ADLT (ELECTROSURGICAL) ×1 IMPLANT
EVACUATOR 1/8 PVC DRAIN (DRAIN) IMPLANT
GAUZE SPONGE 4X4 12PLY STRL (GAUZE/BANDAGES/DRESSINGS) ×2 IMPLANT
GLOVE SRG 8 PF TXTR STRL LF DI (GLOVE) ×1 IMPLANT
GLOVE SURG 8.5 LATEX PF (GLOVE) ×2 IMPLANT
GLOVE SURG LTX SZ9 (GLOVE) ×2 IMPLANT
GLOVE SURG ORTHO LTX SZ7.5 (GLOVE) ×2 IMPLANT
GLOVE SURG UNDER POLY LF SZ8 (GLOVE) ×1
GOWN STRL REUS W/ TWL LRG LVL3 (GOWN DISPOSABLE) ×1 IMPLANT
GOWN STRL REUS W/TWL 2XL LVL3 (GOWN DISPOSABLE) ×4 IMPLANT
GOWN STRL REUS W/TWL LRG LVL3 (GOWN DISPOSABLE) ×1
KIT BASIN OR (CUSTOM PROCEDURE TRAY) ×2 IMPLANT
KIT TURNOVER KIT B (KITS) ×2 IMPLANT
MANIFOLD NEPTUNE II (INSTRUMENTS) ×2 IMPLANT
NDL SPNL 18GX3.5 QUINCKE PK (NEEDLE) ×1 IMPLANT
NEEDLE SPNL 18GX3.5 QUINCKE PK (NEEDLE) ×2 IMPLANT
NS IRRIG 1000ML POUR BTL (IV SOLUTION) ×2 IMPLANT
PACK ORTHO CERVICAL (CUSTOM PROCEDURE TRAY) ×2 IMPLANT
PAD ARMBOARD 7.5X6 YLW CONV (MISCELLANEOUS) ×4 IMPLANT
PATTIES SURGICAL .25X.25 (GAUZE/BANDAGES/DRESSINGS) ×2 IMPLANT
PATTIES SURGICAL .75X.75 (GAUZE/BANDAGES/DRESSINGS) IMPLANT
SPONGE SURGIFOAM ABS GEL 100 (HEMOSTASIS) IMPLANT
SUT ETHIBOND CT1 BRD #0 30IN (SUTURE) IMPLANT
SUT VIC AB 0 CT1 27 (SUTURE)
SUT VIC AB 0 CT1 27XBRD ANBCTR (SUTURE) IMPLANT
SUT VIC AB 2-0 CT1 27 (SUTURE)
SUT VIC AB 2-0 CT1 TAPERPNT 27 (SUTURE) IMPLANT
SUT VIC AB 2-0 UR6 27 (SUTURE) IMPLANT
SUT VIC AB 3-0 X1 27 (SUTURE) ×2 IMPLANT
SUT VICRYL 0 UR6 27IN ABS (SUTURE) ×3 IMPLANT
TAPE CLOTH SURG 4X10 WHT LF (GAUZE/BANDAGES/DRESSINGS) ×1 IMPLANT
TOWEL GREEN STERILE (TOWEL DISPOSABLE) ×2 IMPLANT
TOWEL GREEN STERILE FF (TOWEL DISPOSABLE) ×2 IMPLANT
TRAY FOLEY MTR SLVR 16FR STAT (SET/KITS/TRAYS/PACK) IMPLANT
WATER STERILE IRR 1000ML POUR (IV SOLUTION) ×2 IMPLANT

## 2021-11-21 NOTE — Op Note (Signed)
11/21/2021  11:24 AM  PATIENT:  Adam Hernandez  56 y.o. male  MRN: [id number]  OPERATIVE REPORT  PRE-OPERATIVE DIAGNOSIS:  left C4-5 and left C6-7 spondylosis with foraminal stenosis, left cervical radiculopathy  POST-OPERATIVE DIAGNOSIS:  left C4-5 and left C6-7 spondylosis with foraminal stenosis, left cervical radiculopathy  PROCEDURE:  Procedure(s): LEFT C4-5 AND LEFT C6-7 CERVICAL FORAMINOTOMIES    SURGEON:  Jessy Oto, MD     ASSISTANT:  Benjiman Core, PA-C  (Present throughout the entire procedure and necessary for completion of procedure in a timely manner)     ANESTHESIA:  General,supplemented with local marcaine 0.5% 1:1 exparel 1.3% total 10 cc used on the skin  EBL 50CC  DRAINS: Foley to SD, removed at the end of the case.     COMPLICATIONS:  None.      PROCEDURE:The patient was met in the holding area, and the appropriate cervical left C6-7 and C4-5 levels identified and marked with "x" and my initials. All questions were answered and informed consent signed.   The patient was then transported to OR. The patient was then placed under general anesthesia without difficulty and transferred to the operating room table prone position [place] horseshoe with chest rolls. All pressure points well-padded PAS stockings.. The patient received appropriate preoperative antibiotic prophylaxis.Time-out procedure was called and correct.   Sterile prep with DuraPrep and draped in the usual manner the shoulders were taped downwards and skin traction over the skin of the neck. Following DuraPrep draped in the usual manner. After timeout protocol incision was made approximately C4 to C6 in the midline. This following infiltration of skin and subcutaneous layers with marcaine 0.5% with 1:1 exparel 1.3% total of 10 cc. Incision carried through skin and subcutaneous layers using 10 blade scalpel and electrocautery down to the level ligamentum nuchae. Incision made centered on the spinous  process of C3-C6 Clamps then placed at the interspinous process of C4-5 and a nerve hook  at the interspinous process level C3-4 intraoperative C-arm fluoroscopy identified the clamps at the C4-5 levels and the nerve hook at the C3-4 level. A marking pen used to mark the spinous process of C5. Electrocautery then used to carefully incise the cervical muscles off the left lateral aspect of the spinous process of C4-5. Carefully removing spinous muscles off of the inferior aspect lamina at C4 exposing the C4-5 posterior aspect of the interlaminar space. The OR Microscope sterilely draped and used during this portion procedure. Boss McCollough retractor was inserted. High-speed bur was used to remove a small portion of bone from the inferior aspect of lamina of C4 and the medial 20% of the intra-articular process of C4. Further thinning the superior aspect of the lamina of C5. A 1 mm Kerrison was then used to remove ligamentum flavum from superior aspect of the lamina C5 and the medial aspect of the inferior articular process of C4 approximately 20% exposing the superior articular process of C5.  A 1 mm Kerrison was used to remove bone off the superior aspect of the lamina of C5 and then resecting 20% of the medial aspect of the superior articular process of C4. Ligamentum flavum then easily lifted andand electrocautery used to cauterize epidural veins deep to  the ligamentum flavum and the fibrovascular leash overlying the C5 nerve root  was then resected.Under the operating room microscope the epidural vein layer overlying the posterior aspect of the thecal sac and the C5 nerve root was then carefully lifted using a micro-titanium were  cauterized using bipolar electrocautery the 15 blade scalpel then used to incise this overlying the C5 nerve root releasing the vascular leash a forward angle 3-0 microcurette then used to remove a small portion of bone off the superior and medial aspect of the pedicle further  mobilizing the C5 nerve root bipolar electrocautery to control all bleeding within the axillary area and C5 nerve. Bone wax was applied to bleeding cancellus bone surfaces are excellent hemostasis obtained for C5  nerve root was tracked superiorly and laterally nerve hook. The disc explored using a nerve hook no disc material herniated and an obvious posterior mass effect was felt to represent uncovertebral spur. Following this then hemostasis was obtained using thrombin-soaked Gelfoam and micro-pledgettes. When complete hemostasis was obtained all trial was removed I nerve hook could be easily passed out the neuroforamen without the lateral aspect of the C5  pedicle demonstrate the C5  neuroforamen completely decompressed.Carefully removing spinous muscles off of the inferior aspect lamina at C6 exposing the C 6-7 posterior aspect of the interlaminar space. The magnification headlamp were used during this portion procedure. Boss McCollough retractor was reinserted. High-speed bur was used to remove a small portion of bone from the inferior aspect of lamina of C6 and the medial 20% of the intra-articular process of C6 . Further thinning the superior aspect of the lamina of C7. A 1 mm Kerrison was then used to remove ligamentum flavum from superior aspect of the lamina C7 and the medial aspect of the inferior articular process of C6 approximately 20% exposing the superior articular process of C7.  A 1 mm Kerrison was used to remove bone off the superior aspect of the lamina of C7 and then resecting 20% of the medial aspect of the superior articular process of C7. Ligamentum flavum then easily lifted andand electrocautery used to cauterize epidural veins deep to  the ligamentum flavum and the 5 vascular leash overlying the C7 nerve root  was then resected. The operating room microscope was draped sterilely and brought into the field. Under the operating room microscope the epidural vein layer overlying the posterior  aspect of the thecal sac and the C7  nerve root was then carefully lifted using a micro-titanium were cauterized using bipolar electrocautery the 15 blade scalpel then used to incise this overlying the C7  nerve root releasing the vascular leash a forward angle 3-0 microcurette then used to remove a small portion of bone off the superior and medial aspect of the pedicle further mobilizing the C 7 nerve root bipolar electrocautery to control all bleeding within the axillary area and C7  nerve. Bone wax was applied to bleeding cancellus bone surfaces are excellent hemostasis obtained for C7  nerve root was tracked superiorly and laterally nerve hook. The disc explored and an obvious posterior mass effect was felt to represent uncovertebral spur. Following this then hemostasis was obtained using thrombin-soaked Gelfoam and micro-pledgettes. When complete hemostasis was obtained all trial was removed I nerve hook could be easily passed out the neuroforamen without the lateral aspect of the C7  pedicle demonstrate the C7  neuroforamen completely decompressed.  Irrigation was carried out no active bleeding was present. Following further irrigation and the incision was closed by approximating the ligamentum nuchae with 0 Vicryl sutures. The subcutaneous layers approximated with interrupted 0 Vicryl suture more superficial layers with interrupted 2-0 Vicryl sutures and the skin closed with stainless steel staples. MedPlex bandage applied.. Soft cervical collar all instrument and sponge counts were correct.  Foley catheter removed at the end of the case. Patient was then returned to supine position on his stretcher. Returned to recovery room in satisfactory condition.  Physician assistant's responsibilities: Benjiman Core, PA-C perform the duties of assistant physician and surgeon during this case present from the beginning of the case to the end of the case. He assisted with careful retraction of neural structures  suctioning about the posterior elements including cervical cord and C5 and C7 nerve roots. Performed closure of the incision on the ligamentum nuchae to the skin and application of dressing. He assisted in positioning the patient had removal the patient from the OR table to the stretcher.        [name]  11/21/2021, 11:24 AM

## 2021-11-21 NOTE — H&P (Signed)
Adam Hernandez is an 56 y.o. male.   Chief Complaint: Neck pain and upper extremity radiculopathy HPI: 56 year old white male with history of left C4-5 and left C6-7 foraminal stenosis comes in for preop evaluation.  States that neck pain and left upper extremity radiculopathy unchanged from previous visit.  He is wanting to proceed with left C4-5 and left C6-7 foraminotomies as scheduled.  Today history and physical performed.  Review of systems negative.  Past Medical History:  Diagnosis Date   Anxiety    usually just with tests such as MRI due to claustrophobia   Arm numbness left    cervical disc issues   Arthritis    right leg   GERD (gastroesophageal reflux disease)    Hypertension    Motion sickness    ocean boats   Neuromuscular disorder ([place])    Neuropathy    Obesity (BMI 35.0-39.9 without comorbidity) 09/02/2017    Past Surgical History:  Procedure Laterality Date   ANTERIOR CERVICAL DECOMP/DISCECTOMY FUSION N/A 06/22/2019   Procedure: ANTERIOR CERVICAL DISCECTOMY FUSION C5-6 with plates, screws, allograft bone graft, local bone graft and Vivigen;  Surgeon: Jessy Oto, MD;  Location: [place];  Service: Orthopedics;  Laterality: N/A;   COLONOSCOPY WITH PROPOFOL N/A 04/30/2019   Procedure: COLONOSCOPY WITH BIOPSY;  Surgeon: Lucilla Lame, MD;  Location: [place];  Service: Endoscopy;  Laterality: N/A;   LEG SURGERY Right 2017, 2018   fractured leg and ankle- has screws and pins   LUMBAR LAMINECTOMY/DECOMPRESSION MICRODISCECTOMY N/A 03/20/2021   Procedure: BILATERAL LATERAL RECESS DECOMPRESSION/PARTIAL HEMILAMINECTOMIES LUMBAR FOUR-FIVE;  Surgeon: Jessy Oto, MD;  Location: [place];  Service: Orthopedics;  Laterality: N/A;   POLYPECTOMY N/A 04/30/2019   Procedure: POLYPECTOMY;  Surgeon: Lucilla Lame, MD;  Location: [place];  Service: Endoscopy;  Laterality: N/A;   SHOULDER SURGERY Left 2013   labrial tear   WISDOM TOOTH EXTRACTION      Family  History  Adopted: Yes   Social History:  reports that he has never smoked. He has never used smokeless tobacco. He reports that he does not currently use alcohol. He reports that he does not use drugs.  Allergies: No Known Allergies  Medications Prior to Admission  Medication Sig Dispense Refill   acetaminophen (TYLENOL) 500 MG tablet Take 1,000 mg by mouth every 6 (six) hours as needed for moderate pain.     atorvastatin (LIPITOR) 10 MG tablet TAKE 1 TABLET BY MOUTH DAILY 90 tablet 1   celecoxib (CELEBREX) 200 MG capsule Take 1 capsule (200 mg total) by mouth daily. 30 capsule 0   cetirizine (ZYRTEC) 10 MG tablet Take 10 mg by mouth daily.     cholecalciferol (VITAMIN D3) 25 MCG (1000 UNIT) tablet Take 1,000 Units by mouth daily.     Cyanocobalamin (B-12 PO) Take 1 tablet by mouth daily.     gabapentin (NEURONTIN) 800 MG tablet Take 800 mg by mouth 3 (three) times daily. As directed by pain management     HYDROcodone-acetaminophen (NORCO/VICODIN) 5-325 MG tablet Take 1 tablet by mouth every 6 (six) hours as needed for moderate pain. 10 tablet 0   lisinopril (ZESTRIL) 10 MG tablet TAKE ONE TABLET BY MOUTH DAILY 90 tablet 0   Pyridoxine HCl (B-6 PO) Take 1 tablet by mouth daily.     traMADol (ULTRAM) 50 MG tablet Take 50 mg by mouth 3 (three) times daily as needed for moderate pain.      No results found for  this or any previous visit (from the past 48 hour(s)). No results found.  Review of Systems  Constitutional:  Positive for activity change.  HENT: Negative.    Respiratory: Negative.    Cardiovascular: Negative.   Gastrointestinal: Negative.   Genitourinary: Negative.   Musculoskeletal:  Positive for neck pain and neck stiffness.  Neurological:  Positive for numbness.  Psychiatric/Behavioral: Negative.     Blood pressure 129/74, pulse 72, temperature 98.6 F (37 C), temperature source Oral, resp. rate 17, height 5\' 11"  (1.803 m), weight 120.4 kg, SpO2 97 %. Physical  Exam HENT:     Head: Normocephalic and atraumatic.     Nose: Nose normal.  Cardiovascular:     Rate and Rhythm: Regular rhythm.     Heart sounds: Normal heart sounds.  Pulmonary:     Effort: Pulmonary effort is normal. No respiratory distress.     Breath sounds: Normal breath sounds.  Musculoskeletal:        General: Tenderness present.  Neurological:     Mental Status: He is alert and oriented to person, place, and time.     Assessment/Plan Left C4-5 and left C6-7 foraminal stenosis  We will proceed with left C4-5 and left C6-7 foraminotomies as scheduled.  Surgery procedure discussed.  All questions answered.  Benjiman Core, PA-C 11/21/2021, 7:10 AM

## 2021-11-21 NOTE — Anesthesia Preprocedure Evaluation (Signed)
Anesthesia Evaluation  Patient identified by MRN, date of birth, ID band Patient awake    Reviewed: Allergy & Precautions, NPO status , Patient's Chart, lab work & pertinent test results  Airway Mallampati: II  TM Distance: >3 FB Neck ROM: Full    Dental no notable dental hx.    Pulmonary neg pulmonary ROS,    Pulmonary exam normal breath sounds clear to auscultation       Cardiovascular hypertension, negative cardio ROS Normal cardiovascular exam Rhythm:Regular Rate:Normal     Neuro/Psych Anxiety negative neurological ROS  negative psych ROS   GI/Hepatic Neg liver ROS, GERD  ,  Endo/Other  negative endocrine ROS  Renal/GU negative Renal ROS  negative genitourinary   Musculoskeletal  (+) Arthritis , Osteoarthritis,    Abdominal   Peds negative pediatric ROS (+)  Hematology negative hematology ROS (+)   Anesthesia Other Findings   Reproductive/Obstetrics negative OB ROS                             Anesthesia Physical Anesthesia Plan  ASA: 2  Anesthesia Plan: General   Post-op Pain Management:    Induction: Intravenous  PONV Risk Score and Plan: 2 and Ondansetron, Dexamethasone and Treatment may vary due to age or medical condition  Airway Management Planned: Oral ETT  Additional Equipment:   Intra-op Plan:   Post-operative Plan: Extubation in OR  Informed Consent: I have reviewed the patients History and Physical, chart, labs and discussed the procedure including the risks, benefits and alternatives for the proposed anesthesia with the patient or authorized representative who has indicated his/her understanding and acceptance.     Dental advisory given  Plan Discussed with: CRNA and Surgeon  Anesthesia Plan Comments:         Anesthesia Quick Evaluation

## 2021-11-21 NOTE — Anesthesia Postprocedure Evaluation (Signed)
Anesthesia Post Note  Patient: Adam Hernandez  Procedure(s) Performed: LEFT C4-5 AND LEFT C6-7 CERVICAL FORAMINOTOMIES (Spine Cervical)     Patient location during evaluation: PACU Anesthesia Type: General Level of consciousness: awake and alert Pain management: pain level controlled Vital Signs Assessment: post-procedure vital signs reviewed and stable Respiratory status: spontaneous breathing, nonlabored ventilation, respiratory function stable and patient connected to nasal cannula oxygen Cardiovascular status: blood pressure returned to baseline and stable Postop Assessment: no apparent nausea or vomiting Anesthetic complications: no   No notable events documented.  Last Vitals:  Vitals:   11/21/21 1120 11/21/21 1135  BP: (!) 149/87 133/86  Pulse: 100 96  Resp: 14 19  Temp: 36.6 C   SpO2: 99% 92%    Last Pain:  Vitals:   11/21/21 1120  TempSrc:   PainSc: 2                  Bellamie Turney S

## 2021-11-21 NOTE — Anesthesia Procedure Notes (Addendum)
Procedure Name: Intubation Date/Time: 11/21/2021 7:53 AM Performed by: Michele Rockers, CRNA Pre-anesthesia Checklist: Patient identified, Patient being monitored, Timeout performed, Emergency Drugs available and Suction available Patient Re-evaluated:Patient Re-evaluated prior to induction Oxygen Delivery Method: Circle system utilized Preoxygenation: Pre-oxygenation with 100% oxygen Induction Type: IV induction and Rapid sequence Ventilation: Mask ventilation without difficulty Laryngoscope Size: Miller and 3 Grade View: Grade II Tube type: Oral Tube size: 7.5 mm Number of attempts: 1 Airway Equipment and Method: Stylet Placement Confirmation: ETT inserted through vocal cords under direct vision, positive ETCO2 and breath sounds checked- equal and bilateral Secured at: 22 cm Tube secured with: Tape Dental Injury: Teeth and Oropharynx as per pre-operative assessment

## 2021-11-21 NOTE — Transfer of Care (Signed)
Immediate Anesthesia Transfer of Care Note  Patient: Adam Hernandez  Procedure(s) Performed: LEFT C4-5 AND LEFT C6-7 CERVICAL FORAMINOTOMIES (Spine Cervical)  Patient Location: PACU  Anesthesia Type:General  Level of Consciousness: awake, patient cooperative and responds to stimulation  Airway & Oxygen Therapy: Patient Spontanous Breathing and Patient connected to nasal cannula oxygen  Post-op Assessment: Report given to RN, Post -op Vital signs reviewed and stable and Patient moving all extremities X 4  Post vital signs: Reviewed and stable  Last Vitals:  Vitals Value Taken Time  BP 149/87 11/21/21 1120  Temp    Pulse 102 11/21/21 1121  Resp 26 11/21/21 1121  SpO2 96 % 11/21/21 1121  Vitals shown include unvalidated device data.  Last Pain:  Vitals:   11/21/21 0611  TempSrc:   PainSc: 4       Patients Stated Pain Goal: 2 (44/58/48 3507)  Complications: No notable events documented.

## 2021-11-21 NOTE — Interval H&P Note (Signed)
History and Physical Interval Note:  11/21/2021 7:33 AM  Adam Hernandez  has presented today for surgery, with the diagnosis of left C4-5 and left C6-7 spondylosis with foraminal stenosis, left cervical radiculopathy.  The various methods of treatment have been discussed with the patient and family. After consideration of risks, benefits and other options for treatment, the patient has consented to  Procedure(s): LEFT C4-5 AND LEFT C6-7 CERVICAL FORAMINOTOMIES (N/A) as a surgical intervention.  The patient's history has been reviewed, patient examined, no change in status, stable for surgery.  I have reviewed the patient's chart and labs.  Questions were answered to the patient's satisfaction.     [name]

## 2021-11-21 NOTE — Discharge Instructions (Addendum)
No lifting greater than 10 lbs. °Avoid bending, stooping and twisting. °Walking in house for first week then may start to get out slowly increasing activity using arms. °Keep incision dry for 3 days, may use tegaderm or similar water impervious dressing. °Avoid overhead use of arms and overhead lifting. °Wear collar for comfort. °Use ice as needed for comfort. °

## 2021-11-21 NOTE — Progress Notes (Signed)
Orthopedic Tech Progress Note Patient Details:  Adam Hernandez 1966/03/17 [id number]  Patient ID: Adam Hernandez, male   DOB: [birth year], 56 y.o.   MRN: [id number] Dropped off in pt's room.  Adam Hernandez 11/21/2021, 2:51 PM

## 2021-11-21 NOTE — Brief Op Note (Signed)
11/21/2021  11:15 AM  PATIENT:  Adam Hernandez  56 y.o. male  PRE-OPERATIVE DIAGNOSIS:  left C4-5 and left C6-7 spondylosis with foraminal stenosis, left cervical radiculopathy  POST-OPERATIVE DIAGNOSIS:  left C4-5 and left C6-7 spondylosis with foraminal stenosis, left cervical radiculopathy  PROCEDURE:  Procedure(s): LEFT C4-5 AND LEFT C6-7 CERVICAL FORAMINOTOMIES (N/A)  SURGEON:  Surgeon(s) and Role:    * Jessy Oto, MD - Primary  PHYSICIAN ASSISTANT: Benjiman Core, PA-C  ANESTHESIA:   local and general  EBL:  50 mL   BLOOD ADMINISTERED:none  DRAINS: Urinary Catheter (Foley) Discontinued at the end of the procedure  LOCAL MEDICATIONS USED:  MARCAINE 0.5% 1:1 EXPAREL 1.3%  TOTAL 10cc  SPECIMEN:  No Specimen  DISPOSITION OF SPECIMEN:  N/A  COUNTS:  YES  TOURNIQUET:  * No tourniquets in log *  DICTATION: .Dragon Dictation  PLAN OF CARE: Admit for overnight observation  PATIENT DISPOSITION:  PACU - hemodynamically stable.   Delay start of Pharmacological VTE agent (>24hrs) due to surgical blood loss or risk of bleeding: yes

## 2021-11-22 DIAGNOSIS — M4802 Spinal stenosis, cervical region: Secondary | ICD-10-CM | POA: Diagnosis not present

## 2021-11-22 LAB — BASIC METABOLIC PANEL
Anion gap: 11 (ref 5–15)
BUN: 22 mg/dL — ABNORMAL HIGH (ref 6–20)
CO2: 23 mmol/L (ref 22–32)
Calcium: 8.7 mg/dL — ABNORMAL LOW (ref 8.9–10.3)
Chloride: 102 mmol/L (ref 98–111)
Creatinine, Ser: 1.69 mg/dL — ABNORMAL HIGH (ref 0.61–1.24)
GFR, Estimated: 48 mL/min — ABNORMAL LOW (ref >60)
Glucose, Bld: 171 mg/dL — ABNORMAL HIGH (ref 70–99)
Potassium: 4.1 mmol/L (ref 3.5–5.1)
Sodium: 136 mmol/L (ref 135–145)

## 2021-11-22 LAB — CBC
HCT: 39.3 % (ref 39.0–52.0)
Hemoglobin: 12.8 g/dL — ABNORMAL LOW (ref 13.0–17.0)
MCH: 29.4 pg (ref 26.0–34.0)
MCHC: 32.6 g/dL (ref 30.0–36.0)
MCV: 90.1 fL (ref 80.0–100.0)
Platelets: 272 10*3/uL (ref 150–400)
RBC: 4.36 MIL/uL (ref 4.22–5.81)
RDW: 12.7 % (ref 11.5–15.5)
WBC: 15.1 10*3/uL — ABNORMAL HIGH (ref 4.0–10.5)
nRBC: 0 % (ref 0.0–0.2)

## 2021-11-22 MED ORDER — METHOCARBAMOL 500 MG PO TABS: 500.0000 mg | ORAL_TABLET | Freq: Three times a day (TID) | ORAL | 1 refills | Status: DC | PRN

## 2021-11-22 MED ORDER — HYDROCODONE-ACETAMINOPHEN 7.5-325 MG PO TABS: 2.0000 | ORAL_TABLET | Freq: Four times a day (QID) | ORAL | 0 refills | Status: DC | PRN

## 2021-11-22 MED ORDER — DOCUSATE SODIUM 100 MG PO CAPS: 100.0000 mg | ORAL_CAPSULE | Freq: Two times a day (BID) | ORAL | 0 refills | Status: DC

## 2021-11-22 NOTE — Progress Notes (Signed)
° ° ° °  Subjective: 1 Day Post-Op Procedure(s) (LRB): LEFT C4-5 AND LEFT C6-7 CERVICAL FORAMINOTOMIES (N/A) Awake, alert and oriented x 4. Voiding without difficulty. Standing and walking. Left arm pain is gone. No numbness or paresthesias as preop. Strength is better.   Patient reports pain as moderate.    Objective:   VITALS:  Temp:  [97.8 F (36.6 C)-98.6 F (37 C)] 98 F (36.7 C) (01/11 0745) Pulse Rate:  [61-100] 61 (01/11 0745) Resp:  [10-20] 18 (01/11 0745) BP: (116-149)/(69-87) 116/70 (01/11 0745) SpO2:  [92 %-99 %] 97 % (01/11 0745)  Neurologically intact ABD soft Neurovascular intact Sensation intact distally Intact pulses distally Dorsiflexion/Plantar flexion intact Incision: dressing C/D/I, no drainage, and Change bandage and left xeroform intact.  No cellulitis present Compartment soft   LABS Recent Labs    11/22/21 0256  HGB 12.8*  WBC 15.1*  PLT 272   Recent Labs    11/22/21 0256  NA 136  K 4.1  CL 102  CO2 23  BUN 22*  CREATININE 1.69*  GLUCOSE 171*   No results for input(s): LABPT, INR in the last 72 hours.   Assessment/Plan: 1 Day Post-Op Procedure(s) (LRB): LEFT C4-5 AND LEFT C6-7 CERVICAL FORAMINOTOMIES (N/A)  Advance diet Up with therapy D/C IV fluids Discharge home with home health  Adam Hernandez 11/22/2021, 8:43 AM Patient ID: Adam Hernandez, male   DOB: [birth year], 56 y.o.   MRN: [id number]

## 2021-11-22 NOTE — Evaluation (Signed)
Occupational Therapy Evaluation Patient Details Name: Adam Hernandez MRN: [id number] DOB: Jan 28, 1966 Today's Date: 11/22/2021   History of Present Illness Pt is s/p LEFT C4-5 AND LEFT C6-7 CERVICAL FORAMINOTOMIES.   Clinical Impression   Pt seen for OT eval following posterior cervical fusion/foraminotomy level 2. Cervical precautions reviewed as well as compensatory strategies for ADLs. LUE AROM WFL, sensation intact , reports resolution of LUE pain. Pt currently mod I to independent with ADLs. All OT education complete. No further OT needs indicated, will sign off.      Recommendations for follow up therapy are one component of a multi-disciplinary discharge planning process, led by the attending physician.  Recommendations may be updated based on patient status, additional functional criteria and insurance authorization.   Follow Up Recommendations  No OT follow up    Assistance Recommended at Discharge PRN  Patient can return home with the following      Functional Status Assessment  Patient has not had a recent decline in their functional status  Equipment Recommendations  None recommended by OT    Recommendations for Other Services       Precautions / Restrictions Precautions Precautions: Cervical Required Braces or Orthoses: Other Brace Other Brace: soft collar Restrictions Weight Bearing Restrictions: No      Mobility Bed Mobility Overal bed mobility: Independent;Modified Independent                  Transfers Overall transfer level: Modified independent                        Balance Overall balance assessment: No apparent balance deficits (not formally assessed)                                         ADL either performed or assessed with clinical judgement   ADL Overall ADL's : Modified independent                                       General ADL Comments: reviewed cervical precautions. Pt  familiar with precautions from previous surgeries.     Vision Baseline Vision/History: 1 Wears glasses       Perception     Praxis      Pertinent Vitals/Pain Pain Assessment: Faces Faces Pain Scale: Hurts a little bit Pain Location: surgical site Pain Descriptors / Indicators: Discomfort Pain Intervention(s): Monitored during session     Hand Dominance Right   Extremity/Trunk Assessment Upper Extremity Assessment Upper Extremity Assessment: Overall WFL for tasks assessed (LUE AROM WFL, sensation intact, pt reports no LUE pain)   Lower Extremity Assessment Lower Extremity Assessment: Overall WFL for tasks assessed   Cervical / Trunk Assessment Cervical / Trunk Assessment: Neck Surgery   Communication Communication Communication: No difficulties   Cognition Arousal/Alertness: Awake/alert Behavior During Therapy: WFL for tasks assessed/performed Overall Cognitive Status: Within Functional Limits for tasks assessed                                       General Comments       Exercises     Shoulder Instructions      Home Living Family/patient expects to be discharged to:: Private  residence Living Arrangements: Spouse/significant other;Children Available Help at Discharge: Family;Available PRN/intermittently Type of Home: House Home Access: Stairs to enter [employer] of Steps: 3 Entrance Stairs-Rails: None Home Layout: Two level   Alternate Level Stairs-Rails: Right;Left;Can reach both Bathroom Shower/Tub: Tub/shower unit;Walk-in shower   Bathroom Toilet: Handicapped height     Home Equipment: [place] - single point;Shower seat          Prior Functioning/Environment Prior Level of Function : Independent/Modified Independent                        OT Problem List:        OT Treatment/Interventions:      OT Goals(Current goals can be found in the care plan section) Acute Rehab OT Goals Patient Stated Goal: not  stated  OT Frequency:      Co-evaluation              AM-PAC OT "6 Clicks" Daily Activity     Outcome Measure Help from another person eating meals?: None Help from another person taking care of personal grooming?: None Help from another person toileting, which includes using toliet, bedpan, or urinal?: None Help from another person bathing (including washing, rinsing, drying)?: None Help from another person to put on and taking off regular upper body clothing?: None Help from another person to put on and taking off regular lower body clothing?: None 6 Click Score: 24   End of Session Equipment Utilized During Treatment: Cervical collar (soft collar)  Activity Tolerance: Patient tolerated treatment well Patient left: in bed;with call bell/phone within reach  OT Visit Diagnosis: Pain                Time: 2878-6767 OT Time Calculation (min): 9 min Charges:  OT General Charges $OT Visit: 1 Visit OT Evaluation $OT Eval Low Complexity: [place], OT Acute Rehabilitation Services Office: [phone]   Hortencia Pilar 11/22/2021, 9:18 AM

## 2021-11-22 NOTE — Progress Notes (Signed)
Patient awaiting transport via wheelchair by volunteer for discharge home; in no acute distress nor complaints of pain nor discomfort; incision on his neck with honeycomb dressing and is clean, dry and intact with soft collar on; room was checked and accounted for all his belongings; discharge instructions concerning his medications, incision care, follow up appointment and when to call the doctor as needed were all discussed with patient by RN and he expressed understanding on the instructions given.

## 2021-11-22 NOTE — Plan of Care (Signed)

## 2021-11-22 NOTE — Progress Notes (Signed)
PT Cancellation Note  Patient Details Name: Adam Hernandez MRN: [id number] DOB: 1966/04/04   Cancelled Treatment:    Reason Eval/Treat Not Completed: PT screened, no needs identified, will sign off Pt independent, walking hallways with OT.  [name], PT, DPT Acute Rehabilitation Services Pager [phone] Office [phone]    Deno Etienne 11/22/2021, 9:11 AM

## 2021-11-23 ENCOUNTER — Encounter (HOSPITAL_COMMUNITY): Payer: Self-pay | Admitting: Specialist

## 2021-11-24 NOTE — Discharge Summary (Signed)
Patient ID: Adam Hernandez MRN: [id number] DOB/AGE: September 15, 1966 56 y.o.  Admit date: 11/21/2021 Discharge date: 11/22/2021  Admission Diagnoses:  Principal Problem:   Other spondylosis with radiculopathy, cervical region   Discharge Diagnoses:  Principal Problem:   Other spondylosis with radiculopathy, cervical region  status post Procedure(s): LEFT C4-5 AND LEFT C6-7 CERVICAL FORAMINOTOMIES  Past Medical History:  Diagnosis Date   Anxiety    usually just with tests such as MRI due to claustrophobia   Arm numbness left    cervical disc issues   Arthritis    right leg   GERD (gastroesophageal reflux disease)    Hypertension    Motion sickness    ocean boats   Neuromuscular disorder ([place])    Neuropathy    Obesity (BMI 35.0-39.9 without comorbidity) 09/02/2017    Surgeries: Procedure(s): LEFT C4-5 AND LEFT C6-7 CERVICAL FORAMINOTOMIES on 11/21/2021   Consultants:   Discharged Condition: Improved  Hospital Course: Adam Hernandez is an 56 y.o. male who was admitted 11/21/2021 for operative treatment of Other spondylosis with radiculopathy, cervical region. Patient failed conservative treatments (please see the history and physical for the specifics) and had severe unremitting pain that affects sleep, daily activities and work/hobbies. After pre-op clearance, the patient was taken to the operating room on 11/21/2021 and underwent  Procedure(s): LEFT C4-5 AND LEFT C6-7 CERVICAL FORAMINOTOMIES.    Patient was given perioperative antibiotics:  Anti-infectives (From admission, onward)    Start     Dose/Rate Route Frequency Ordered Stop   11/21/21 1600  ceFAZolin (ANCEF) IVPB 2g/100 mL premix        2 g 200 mL/hr over 30 Minutes Intravenous Every 8 hours 11/21/21 1308 11/22/21 0038   11/21/21 0600  ceFAZolin (ANCEF) IVPB 3g/100 mL premix        3 g 200 mL/hr over 30 Minutes Intravenous On call to O.R. 11/21/21 0544 11/21/21 0815        Patient was given sequential  compression devices and early ambulation to prevent DVT.   Patient benefited maximally from hospital stay and there were no complications. At the time of discharge, the patient was urinating/moving their bowels without difficulty, tolerating a regular diet, pain is controlled with oral pain medications and they have been cleared by PT/OT.   Recent vital signs: No data found.   Recent laboratory studies:  Recent Labs    11/22/21 0256  WBC 15.1*  HGB 12.8*  HCT 39.3  PLT 272  NA 136  K 4.1  CL 102  CO2 23  BUN 22*  CREATININE 1.69*  GLUCOSE 171*  CALCIUM 8.7*     Discharge Medications:   Allergies as of 11/22/2021   No Known Allergies      Medication List     STOP taking these medications    HYDROcodone-acetaminophen 5-325 MG tablet Commonly known as: NORCO/VICODIN Replaced by: HYDROcodone-acetaminophen 7.5-325 MG tablet       TAKE these medications    acetaminophen 500 MG tablet Commonly known as: TYLENOL Take 1,000 mg by mouth every 6 (six) hours as needed for moderate pain.   atorvastatin 10 MG tablet Commonly known as: LIPITOR TAKE 1 TABLET BY MOUTH DAILY   B-12 PO Take 1 tablet by mouth daily.   B-6 PO Take 1 tablet by mouth daily.   celecoxib 200 MG capsule Commonly known as: CELEBREX Take 1 capsule (200 mg total) by mouth daily.   cetirizine 10 MG tablet Commonly known as: ZYRTEC Take 10 mg  by mouth daily.   cholecalciferol 25 MCG (1000 UNIT) tablet Commonly known as: VITAMIN D3 Take 1,000 Units by mouth daily.   docusate sodium 100 MG capsule Commonly known as: COLACE Take 1 capsule (100 mg total) by mouth 2 (two) times daily.   gabapentin 800 MG tablet Commonly known as: NEURONTIN Take 800 mg by mouth 3 (three) times daily. As directed by pain management   HYDROcodone-acetaminophen 7.5-325 MG tablet Commonly known as: NORCO Take 2 tablets by mouth every 6 (six) hours as needed for severe pain ((score 7 to 10)). Replaces:  HYDROcodone-acetaminophen 5-325 MG tablet   lisinopril 10 MG tablet Commonly known as: ZESTRIL TAKE ONE TABLET BY MOUTH DAILY   methocarbamol 500 MG tablet Commonly known as: ROBAXIN Take 1 tablet (500 mg total) by mouth every 8 (eight) hours as needed for muscle spasms.   traMADol 50 MG tablet Commonly known as: ULTRAM Take 50 mg by mouth 3 (three) times daily as needed for moderate pain.        Diagnostic Studies: DG Cervical Spine 2 or 3 views  Result Date: 11/21/2021 CLINICAL DATA:  Surgery, confirm levels EXAM: CERVICAL SPINE - 2-3 VIEW COMPARISON:  None. FINDINGS: Suboptimal lateral cervical spine intraoperative images. On the radiographic image time stamped 9:04:19 AM, the hook instrument is at the level of C3-C4 in the inferior instrument is at C4-C5. The radiographic image time stamped 9:05:17 AM, there is significant image saturation and the levels cannot be confirmed, per discussion with the OR team, the instruments were not moved in between radiographs. IMPRESSION: The superior hooked instrument is at the level of C3-C4 in the inferior instrument is at C4-C5. These results were called by telephone at the time of interpretation on 11/21/2021 at 9:23 am to OR team, who verbally acknowledged these results. Electronically Signed   By: Maurine Simmering M.D.   On: 11/21/2021 09:24   DG C-Arm 1-60 Min-No Report  Result Date: 11/21/2021 Fluoroscopy was utilized by the requesting physician.  No radiographic interpretation.    Discharge Instructions     Call MD / Call 911   Complete by: As directed    If you experience chest pain or shortness of breath, CALL 911 and be transported to the hospital emergency room.  If you develope a fever above 101 F, pus (white drainage) or increased drainage or redness at the wound, or calf pain, call your surgeon's office.   Constipation Prevention   Complete by: As directed    Drink plenty of fluids.  Prune juice may be helpful.  You may use a stool  softener, such as Colace (over the counter) 100 mg twice a day.  Use MiraLax (over the counter) for constipation as needed.   Diet - low sodium heart healthy   Complete by: As directed    Discharge instructions   Complete by: As directed    No lifting greater than 10 lbs. Avoid bending, stooping and twisting. Walking in house for first week then may start to get out slowly increasing activity using arms. Keep incision dry for 3 days, may use tegaderm or similar water impervious dressing. Avoid overhead use of arms and overhead lifting. Wear collar for comfort. Use ice as needed for comfort.   Increase activity slowly as tolerated   Complete by: As directed    Post-operative opioid taper instructions:   Complete by: As directed    POST-OPERATIVE OPIOID TAPER INSTRUCTIONS: It is important to wean off of your opioid medication as soon  as possible. If you do not need pain medication after your surgery it is ok to stop day one. Opioids include: Codeine, Hydrocodone(Norco, Vicodin), Oxycodone(Percocet, oxycontin) and hydromorphone amongst others.  Long term and even short term use of opiods can cause: Increased pain response Dependence Constipation Depression Respiratory depression And more.  Withdrawal symptoms can include Flu like symptoms Nausea, vomiting And more Techniques to manage these symptoms Hydrate well Eat regular healthy meals Stay active Use relaxation techniques(deep breathing, meditating, yoga) Do Not substitute Alcohol to help with tapering If you have been on opioids for less than two weeks and do not have pain than it is ok to stop all together.  Plan to wean off of opioids This plan should start within one week post op of your joint replacement. Maintain the same interval or time between taking each dose and first decrease the dose.  Cut the total daily intake of opioids by one tablet each day Next start to increase the time between doses. The last dose that  should be eliminated is the evening dose.           Follow-up Information     Jessy Oto, MD Follow up in 2 week(s).   Specialty: Orthopedic Surgery Why: For wound re-check Contact information: [place] [address] [phone]                 Discharge Plan:  discharge to home  Disposition:     Signed: Benjiman Core  11/24/2021, 5:22 PM

## 2021-11-27 ENCOUNTER — Other Ambulatory Visit: Payer: Self-pay | Admitting: Specialist

## 2021-11-27 ENCOUNTER — Encounter: Payer: Self-pay | Admitting: Specialist

## 2021-11-28 ENCOUNTER — Other Ambulatory Visit: Payer: Self-pay | Admitting: Radiology

## 2021-11-28 MED ORDER — HYDROCODONE-ACETAMINOPHEN 7.5-325 MG PO TABS: 2.0000 | ORAL_TABLET | Freq: Four times a day (QID) | ORAL | 0 refills | Status: DC | PRN

## 2021-12-01 ENCOUNTER — Encounter: Payer: Self-pay | Admitting: Specialist

## 2021-12-01 ENCOUNTER — Other Ambulatory Visit: Payer: Self-pay

## 2021-12-01 ENCOUNTER — Ambulatory Visit (INDEPENDENT_AMBULATORY_CARE_PROVIDER_SITE_OTHER): Payer: Medicare Other | Admitting: Specialist

## 2021-12-01 VITALS — BP 129/83 | HR 92 | Ht 71.0 in | Wt 265.5 lb

## 2021-12-01 DIAGNOSIS — Z9889 Other specified postprocedural states: Secondary | ICD-10-CM

## 2021-12-01 MED FILL — Thrombin For Soln 20000 Unit: CUTANEOUS | Qty: 1 | Status: AC

## 2021-12-01 NOTE — Progress Notes (Signed)
Post-Op Visit Note   Patient: Adam Hernandez           Date of Birth: September 30, 1966           MRN: [id number] Visit Date: 12/01/2021 PCP: Overton Mam, MD   Assessment & Plan:10 days post op left C4-5 and C6-7 foramenotomies  Chief Complaint:  Chief Complaint  Patient presents with   Neck - Routine Post Op  Incision is healed Staples removed Motor is normal both arms. Legs NV normal Staples out and steristrips applied. Visit Diagnoses: No diagnosis found.  Plan:  No lifting greater than 10 lbs. Give the muscles total of 6 weeks to heal. Avoid bending, stooping and twisting. Walking in house for first week then may start to get out slowly increasing activity using arms. May shower. Avoid overhead use of arms and overhead lifting. Wear collar for comfort. Use ice as needed for comfort.  Tylenol  and muscle relaxers for pain.   Follow-Up Instructions: Return in about 4 weeks (around 12/29/2021).   Orders:  No orders of the defined types were placed in this encounter.  No orders of the defined types were placed in this encounter.   Imaging: No results found.  PMFS History: Patient Active Problem List   Diagnosis Date Noted   Spinal stenosis of lumbar region 03/20/2021    Priority: High    Class: Chronic   Spondylosis without myelopathy or radiculopathy, cervical region 06/22/2019    Priority: Medium     Class: Chronic   Other spondylosis with radiculopathy, cervical region 11/21/2021   Stenosis of lateral recess of lumbar spine 03/20/2021   Herniation of cervical intervertebral disc with radiculopathy 06/22/2019   Status post cervical spinal fusion 06/22/2019   Encounter for screening colonoscopy    Polyp of sigmoid colon    Synovitis and tenosynovitis 01/06/2019   Strain of peroneal tendon 12/23/2018   Closed dislocation of ankle 04/29/2018   Elevated blood pressure reading 09/02/2017   Neuropathy of right ankle 09/02/2017   Obesity (BMI 35.0-39.9  without comorbidity) 09/02/2017   Injury of nerve of lower extremity 09/02/2017   Closed bimalleolar fracture 09/26/2016   Loose body in ankle and foot joint 09/26/2016   Sprain of deltoid ligament of ankle 09/26/2016   Past Medical History:  Diagnosis Date   Anxiety    usually just with tests such as MRI due to claustrophobia   Arm numbness left    cervical disc issues   Arthritis    right leg   GERD (gastroesophageal reflux disease)    Hypertension    Motion sickness    ocean boats   Neuromuscular disorder ([place])    Neuropathy    Obesity (BMI 35.0-39.9 without comorbidity) 09/02/2017    Family History  Adopted: Yes    Past Surgical History:  Procedure Laterality Date   ANTERIOR CERVICAL DECOMP/DISCECTOMY FUSION N/A 06/22/2019   Procedure: ANTERIOR CERVICAL DISCECTOMY FUSION C5-6 with plates, screws, allograft bone graft, local bone graft and Vivigen;  Surgeon: Jessy Oto, MD;  Location: [place];  Service: Orthopedics;  Laterality: N/A;   COLONOSCOPY WITH PROPOFOL N/A 04/30/2019   Procedure: COLONOSCOPY WITH BIOPSY;  Surgeon: Lucilla Lame, MD;  Location: [place];  Service: Endoscopy;  Laterality: N/A;   LEG SURGERY Right 2017, 2018   fractured leg and ankle- has screws and pins   LUMBAR LAMINECTOMY/DECOMPRESSION MICRODISCECTOMY N/A 03/20/2021   Procedure: BILATERAL LATERAL RECESS DECOMPRESSION/PARTIAL HEMILAMINECTOMIES LUMBAR FOUR-FIVE;  Surgeon: Jessy Oto, MD;  Location: [place];  Service: Orthopedics;  Laterality: N/A;   POLYPECTOMY N/A 04/30/2019   Procedure: POLYPECTOMY;  Surgeon: Lucilla Lame, MD;  Location: [place];  Service: Endoscopy;  Laterality: N/A;   POSTERIOR CERVICAL FUSION/FORAMINOTOMY N/A 11/21/2021   Procedure: LEFT C4-5 AND LEFT C6-7 CERVICAL FORAMINOTOMIES;  Surgeon: Jessy Oto, MD;  Location: [place];  Service: Orthopedics;  Laterality: N/A;   SHOULDER SURGERY Left 2013   labrial tear   WISDOM TOOTH EXTRACTION     Social History    Occupational History   Not on file  Tobacco Use   Smoking status: Never   Smokeless tobacco: Never  Vaping Use   Vaping Use: Never used  Substance and Sexual Activity   Alcohol use: Not Currently    Comment: very rarely   Drug use: Never   Sexual activity: Yes

## 2021-12-01 NOTE — Patient Instructions (Signed)
No lifting greater than 10 lbs. Give the muscles total of 6 weeks to heal. Avoid bending, stooping and twisting. Walking in house for first week then may start to get out slowly increasing activity using arms. May shower. Avoid overhead use of arms and overhead lifting. Wear collar for comfort. Use ice as needed for comfort.  Tylenol  and muscle relaxers for pain.

## 2021-12-04 ENCOUNTER — Other Ambulatory Visit: Payer: Self-pay | Admitting: Radiology

## 2021-12-04 MED ORDER — METHOCARBAMOL 500 MG PO TABS: 500.0000 mg | ORAL_TABLET | Freq: Three times a day (TID) | ORAL | 1 refills | Status: DC | PRN

## 2021-12-04 MED ORDER — HYDROCODONE-ACETAMINOPHEN 7.5-325 MG PO TABS: 2.0000 | ORAL_TABLET | Freq: Four times a day (QID) | ORAL | 0 refills | Status: DC | PRN

## 2021-12-29 ENCOUNTER — Other Ambulatory Visit: Payer: Self-pay

## 2021-12-29 ENCOUNTER — Ambulatory Visit (INDEPENDENT_AMBULATORY_CARE_PROVIDER_SITE_OTHER): Payer: Medicare Other | Admitting: Specialist

## 2021-12-29 ENCOUNTER — Encounter: Payer: Self-pay | Admitting: Specialist

## 2021-12-29 VITALS — BP 121/81 | HR 60 | Ht 71.0 in | Wt 265.5 lb

## 2021-12-29 DIAGNOSIS — Z9889 Other specified postprocedural states: Secondary | ICD-10-CM

## 2021-12-29 DIAGNOSIS — M5412 Radiculopathy, cervical region: Secondary | ICD-10-CM

## 2021-12-29 NOTE — Progress Notes (Signed)
Post-Op Visit Note   Patient: Adam Hernandez           Date of Birth: 1966-03-09           MRN: [id number] Visit Date: 12/29/2021 PCP: Overton Mam, MD   Assessment & Plan:5.5 weeks post op left C4-5 and C6-7 foramenotomies for spondylosis has some left L4 weakness some worse post op.   Chief Complaint:  Chief Complaint  Patient presents with   Neck - Routine Post Op  Incision is healed, No fluctuance, no erythrema or drainage. Motor intact with some left shoulder abduction weakness 4/5.  Visit Diagnoses: No diagnosis found.  Plan: Avoid overhead lifting and overhead use of the arms. Do not lift greater than 5 lbs. Adjust head rest in vehicle to prevent hyperextension if rear ended. Take extra precautions to avoid falling. Left UE muscle strengthening.  If painful you are overdoing exercise and go for light weights and gradually increase weights every 2 weeks.  If you wish to have therapy call and we would arrange.  Follow-Up Instructions: No follow-ups on file.   Orders:  No orders of the defined types were placed in this encounter.  No orders of the defined types were placed in this encounter.   Imaging: No results found.  PMFS History: Patient Active Problem List   Diagnosis Date Noted   Spinal stenosis of lumbar region 03/20/2021    Priority: High    Class: Chronic   Spondylosis without myelopathy or radiculopathy, cervical region 06/22/2019    Priority: Medium     Class: Chronic   Other spondylosis with radiculopathy, cervical region 11/21/2021   Stenosis of lateral recess of lumbar spine 03/20/2021   Herniation of cervical intervertebral disc with radiculopathy 06/22/2019   Status post cervical spinal fusion 06/22/2019   Encounter for screening colonoscopy    Polyp of sigmoid colon    Synovitis and tenosynovitis 01/06/2019   Strain of peroneal tendon 12/23/2018   Closed dislocation of ankle 04/29/2018   Elevated blood pressure reading 09/02/2017    Neuropathy of right ankle 09/02/2017   Obesity (BMI 35.0-39.9 without comorbidity) 09/02/2017   Injury of nerve of lower extremity 09/02/2017   Closed bimalleolar fracture 09/26/2016   Loose body in ankle and foot joint 09/26/2016   Sprain of deltoid ligament of ankle 09/26/2016   Past Medical History:  Diagnosis Date   Anxiety    usually just with tests such as MRI due to claustrophobia   Arm numbness left    cervical disc issues   Arthritis    right leg   GERD (gastroesophageal reflux disease)    Hypertension    Motion sickness    ocean boats   Neuromuscular disorder ([place])    Neuropathy    Obesity (BMI 35.0-39.9 without comorbidity) 09/02/2017    Family History  Adopted: Yes    Past Surgical History:  Procedure Laterality Date   ANTERIOR CERVICAL DECOMP/DISCECTOMY FUSION N/A 06/22/2019   Procedure: ANTERIOR CERVICAL DISCECTOMY FUSION C5-6 with plates, screws, allograft bone graft, local bone graft and Vivigen;  Surgeon: Jessy Oto, MD;  Location: [place];  Service: Orthopedics;  Laterality: N/A;   COLONOSCOPY WITH PROPOFOL N/A 04/30/2019   Procedure: COLONOSCOPY WITH BIOPSY;  Surgeon: Lucilla Lame, MD;  Location: [place];  Service: Endoscopy;  Laterality: N/A;   LEG SURGERY Right 2017, 2018   fractured leg and ankle- has screws and pins   LUMBAR LAMINECTOMY/DECOMPRESSION MICRODISCECTOMY N/A 03/20/2021   Procedure: BILATERAL LATERAL  RECESS DECOMPRESSION/PARTIAL HEMILAMINECTOMIES LUMBAR FOUR-FIVE;  Surgeon: Jessy Oto, MD;  Location: [place];  Service: Orthopedics;  Laterality: N/A;   POLYPECTOMY N/A 04/30/2019   Procedure: POLYPECTOMY;  Surgeon: Lucilla Lame, MD;  Location: [facility];  Service: Endoscopy;  Laterality: N/A;   POSTERIOR CERVICAL FUSION/FORAMINOTOMY N/A 11/21/2021   Procedure: LEFT C4-5 AND LEFT C6-7 CERVICAL FORAMINOTOMIES;  Surgeon: Jessy Oto, MD;  Location: [facility] [place];  Service: Orthopedics;  Laterality: N/A;   SHOULDER SURGERY Left  2013   labrial tear   WISDOM TOOTH EXTRACTION     Social History   Occupational History   Not on file  Tobacco Use   Smoking status: Never   Smokeless tobacco: Never  Vaping Use   Vaping Use: Never used  Substance and Sexual Activity   Alcohol use: Not Currently    Comment: very rarely   Drug use: Never   Sexual activity: Yes

## 2021-12-29 NOTE — Patient Instructions (Signed)
Plan: Avoid overhead lifting and overhead use of the arms. Do not lift greater than 5 lbs. Adjust head rest in vehicle to prevent hyperextension if rear ended. Take extra precautions to avoid falling. Left UE muscle strengthening. If painful you are overdoing exercise and go for light weights and gradually increase weights every 2 weeks.  If you wish to have therapy call and we would arrange.

## 2022-01-04 DIAGNOSIS — R311 Benign essential microscopic hematuria: Secondary | ICD-10-CM | POA: Diagnosis not present

## 2022-02-09 ENCOUNTER — Ambulatory Visit: Payer: Medicare Other | Admitting: Specialist

## 2022-02-12 ENCOUNTER — Encounter: Payer: Medicare Other | Admitting: Family Medicine

## 2022-02-14 ENCOUNTER — Ambulatory Visit: Payer: Medicare Other | Admitting: Specialist

## 2022-02-14 ENCOUNTER — Encounter: Payer: Self-pay | Admitting: Specialist

## 2022-02-14 VITALS — BP 101/70 | HR 71 | Ht 71.0 in | Wt 265.5 lb

## 2022-02-14 DIAGNOSIS — M4722 Other spondylosis with radiculopathy, cervical region: Secondary | ICD-10-CM

## 2022-02-14 DIAGNOSIS — M5412 Radiculopathy, cervical region: Secondary | ICD-10-CM

## 2022-02-14 NOTE — Progress Notes (Signed)
? ?Post-Op Visit Note ?  ?Patient: Adam Hernandez           ?Date of Birth: [birth year]           ?MRN: [id number] ?Visit Date: 02/14/2022 ?PCP: Overton Mam, MD ? ? ?Assessment & Plan:3 months post op left C4-5 and C5-6 foramenotomies for spondylosis ? ?Chief Complaint:  ?Chief Complaint  ?Patient presents with  ? Neck - Follow-up  ?ROM of the cervical spine with mild decreased ROM by 20% ?UE motor with minimal left shoulder abduction weakness. ?No sensory deficit. ? ?Visit Diagnoses: No diagnosis found. ? ?Plan: Avoid overhead lifting and overhead use of the arms. ?Do not lift greater than 5 lbs. ?Adjust head rest in vehicle to prevent hyperextension if rear ended. ?Take extra precautions to avoid falling. ? ?Follow-Up Instructions: No follow-ups on file.  ? ?Orders:  ?No orders of the defined types were placed in this encounter. ? ?No orders of the defined types were placed in this encounter. ? ? ?Imaging: ?No results found. ? ?PMFS History: ?Patient Active Problem List  ? Diagnosis Date Noted  ? Spinal stenosis of lumbar region 03/20/2021  ?  Priority: High  ?  Class: Chronic  ? Spondylosis without myelopathy or radiculopathy, cervical region 06/22/2019  ?  Priority: Medium   ?  Class: Chronic  ? Other spondylosis with radiculopathy, cervical region 11/21/2021  ? Stenosis of lateral recess of lumbar spine 03/20/2021  ? Herniation of cervical intervertebral disc with radiculopathy 06/22/2019  ? Status post cervical spinal fusion 06/22/2019  ? Encounter for screening colonoscopy   ? Polyp of sigmoid colon   ? Synovitis and tenosynovitis 01/06/2019  ? Strain of peroneal tendon 12/23/2018  ? Closed dislocation of ankle 04/29/2018  ? Elevated blood pressure reading 09/02/2017  ? Neuropathy of right ankle 09/02/2017  ? Obesity (BMI 35.0-39.9 without comorbidity) 09/02/2017  ? Injury of nerve of lower extremity 09/02/2017  ? Closed bimalleolar fracture 09/26/2016  ? Loose body in ankle and foot joint 09/26/2016  ?  Sprain of deltoid ligament of ankle 09/26/2016  ? ?Past Medical History:  ?Diagnosis Date  ? Anxiety   ? usually just with tests such as MRI due to claustrophobia  ? Arm numbness left   ? cervical disc issues  ? Arthritis   ? right leg  ? GERD (gastroesophageal reflux disease)   ? Hypertension   ? Motion sickness   ? ocean boats  ? Neuromuscular disorder ([place])   ? Neuropathy   ? Obesity (BMI 35.0-39.9 without comorbidity) 09/02/2017  ?  ?Family History  ?Adopted: Yes  ?  ?Past Surgical History:  ?Procedure Laterality Date  ? ANTERIOR CERVICAL DECOMP/DISCECTOMY FUSION N/A 06/22/2019  ? Procedure: ANTERIOR CERVICAL DISCECTOMY FUSION C5-6 with plates, screws, allograft bone graft, local bone graft and Vivigen;  Surgeon: Jessy Oto, MD;  Location: [place];  Service: Orthopedics;  Laterality: N/A;  ? COLONOSCOPY WITH PROPOFOL N/A 04/30/2019  ? Procedure: COLONOSCOPY WITH BIOPSY;  Surgeon: Lucilla Lame, MD;  Location: [place];  Service: Endoscopy;  Laterality: N/A;  ? LEG SURGERY Right 2017, 2018  ? fractured leg and ankle- has screws and pins  ? LUMBAR LAMINECTOMY/DECOMPRESSION MICRODISCECTOMY N/A 03/20/2021  ? Procedure: BILATERAL LATERAL RECESS DECOMPRESSION/PARTIAL HEMILAMINECTOMIES LUMBAR FOUR-FIVE;  Surgeon: Jessy Oto, MD;  Location: [place];  Service: Orthopedics;  Laterality: N/A;  ? POLYPECTOMY N/A 04/30/2019  ? Procedure: POLYPECTOMY;  Surgeon: Lucilla Lame, MD;  Location: [facility];  Service: Endoscopy;  Laterality: N/A;  ? POSTERIOR CERVICAL FUSION/FORAMINOTOMY N/A 11/21/2021  ? Procedure: LEFT C4-5 AND LEFT C6-7 CERVICAL FORAMINOTOMIES;  Surgeon: Jessy Oto, MD;  Location: [facility];  Service: Orthopedics;  Laterality: N/A;  ? SHOULDER SURGERY Left 2013  ? labrial tear  ? WISDOM TOOTH EXTRACTION    ? ?Social History  ? ?Occupational History  ? Not on file  ?Tobacco Use  ? Smoking status: Never  ? Smokeless tobacco: Never  ?Vaping Use  ? Vaping Use: Never used  ?Substance and Sexual  Activity  ? Alcohol use: Not Currently  ?  Comment: very rarely  ? Drug use: Never  ? Sexual activity: Yes  ? ? ? ?

## 2022-02-14 NOTE — Patient Instructions (Signed)
Plan: Avoid overhead lifting and overhead use of the arms. ?Do not lift greater than 15 -25lbs. ?Adjust head rest in vehicle to prevent hyperextension if rear ended. ?Take extra precautions to avoid fallin ?

## 2022-08-11 ENCOUNTER — Encounter: Payer: Self-pay | Admitting: Specialist
